# Patient Record
Sex: Female | Born: 1995 | Race: Black or African American | Hispanic: No | Marital: Single | Smoking: Never smoker
Health system: Southern US, Community
[De-identification: ages and names within clinical notes are randomized; demographics above are authoritative.]

## PROBLEM LIST (undated history)

## (undated) DIAGNOSIS — J45909 Unspecified asthma, uncomplicated: Secondary | ICD-10-CM

---

## 2017-05-12 ENCOUNTER — Emergency Department (HOSPITAL_COMMUNITY): Payer: Self-pay

## 2017-05-12 ENCOUNTER — Emergency Department (HOSPITAL_COMMUNITY)
Admission: EM | Admit: 2017-05-12 | Discharge: 2017-05-12 | Disposition: A | Discharge status: Home / Self Care | Payer: Self-pay | Attending: Emergency Medicine | Admitting: Emergency Medicine

## 2017-05-12 ENCOUNTER — Encounter (HOSPITAL_COMMUNITY): Payer: Self-pay | Admitting: *Deleted

## 2017-05-12 DIAGNOSIS — R52 Pain, unspecified: Secondary | ICD-10-CM | POA: Insufficient documentation

## 2017-05-12 DIAGNOSIS — R059 Cough, unspecified: Secondary | ICD-10-CM

## 2017-05-12 DIAGNOSIS — J45909 Unspecified asthma, uncomplicated: Secondary | ICD-10-CM | POA: Insufficient documentation

## 2017-05-12 DIAGNOSIS — R05 Cough: Secondary | ICD-10-CM | POA: Insufficient documentation

## 2017-05-12 HISTORY — DX: Unspecified asthma, uncomplicated: J45.909

## 2017-05-12 LAB — POC URINE PREG, ED: Preg Test, Ur: NEGATIVE

## 2017-05-12 MED ORDER — BENZONATATE 100 MG PO CAPS: 100.0000 mg | ORAL_CAPSULE | Freq: Three times a day (TID) | ORAL | 0 refills | Status: DC

## 2017-05-12 MED ORDER — KETOROLAC TROMETHAMINE 60 MG/2ML IM SOLN
60.0000 mg | Freq: Once | INTRAMUSCULAR | Status: AC
  Administered 2017-05-12: 60 mg via INTRAMUSCULAR
  Filled 2017-05-12: qty 2

## 2017-05-12 MED ORDER — IBUPROFEN 600 MG PO TABS: 600.0000 mg | ORAL_TABLET | Freq: Four times a day (QID) | ORAL | 0 refills | Status: DC | PRN

## 2017-05-12 MED ORDER — ONDANSETRON 4 MG PO TBDP
4.0000 mg | ORAL_TABLET | Freq: Once | ORAL | Status: AC
  Administered 2017-05-12: 4 mg via ORAL
  Filled 2017-05-12: qty 1

## 2017-05-12 MED ORDER — IPRATROPIUM-ALBUTEROL 0.5-2.5 (3) MG/3ML IN SOLN
3.0000 mL | Freq: Once | RESPIRATORY_TRACT | Status: AC
  Administered 2017-05-12: 3 mL via RESPIRATORY_TRACT
  Filled 2017-05-12: qty 3

## 2017-05-12 MED ORDER — ONDANSETRON 4 MG PO TBDP: 4.0000 mg | ORAL_TABLET | Freq: Three times a day (TID) | ORAL | 0 refills | Status: DC | PRN

## 2017-05-12 NOTE — ED Provider Notes (Signed)
[facility]-EMERGENCY DEPT Provider Note   CSN: [id number] Arrival date & time: 05/12/17  1191     History   Chief Complaint Chief Complaint  Patient presents with  . Generalized Body Aches    HPI Jennifer Washington is a 21 y.o. female.  HPI   Jennifer Washington is a 21 y.o. female, with a history of Asthma, presenting to the ED with nonproductive cough beginning 3 days ago. Complains of central chest pain, "shocky, burning, stinging," nonradiating, rated 10/10. Chest pain and back pain worsen with coughing, deep breathing, and palpation. Also endorses nasal and sinus congestion with sinus pain, sore throat, postnasal drip, subjective fever, and body aches. Has taken DayQuil without improvement. Has not drunk any water last three days. Has not had to use inhalers.   Denies diarrhea, vomiting, rashes, neck pain/stiffness, abdominal pain, shortness of breath, or any other complaints.     Past Medical History:  Diagnosis Date  . Asthma     There are no active problems to display for this patient.   History reviewed. No pertinent surgical history.  OB History    No data available       Home Medications    Prior to Admission medications   Medication Sig Start Date End Date Taking? Authorizing Provider  etonogestrel (NEXPLANON) 68 MG IMPL implant 1 each by Subdermal route once.   Yes [provider]  benzonatate (TESSALON) 100 MG capsule Take 1 capsule (100 mg total) by mouth every 8 (eight) hours. 05/12/17   Oliwia Berzins C, PA-C  ibuprofen (ADVIL,MOTRIN) 600 MG tablet Take 1 tablet (600 mg total) by mouth every 6 (six) hours as needed. 05/12/17   Rieley Hausman C, PA-C  ondansetron (ZOFRAN ODT) 4 MG disintegrating tablet Take 1 tablet (4 mg total) by mouth every 8 (eight) hours as needed for nausea or vomiting. 05/12/17   Marcoantonio Legault, Hillard Danker, PA-C    Family History No family history on file.  Social History Social History  Substance Use Topics  . Smoking status: Never Smoker  .  Smokeless tobacco: Never Used  . Alcohol use No     Allergies   Patient has no known allergies.   Review of Systems Review of Systems  Constitutional: Positive for chills and fever.  Respiratory: Positive for cough. Negative for shortness of breath.   Cardiovascular: Positive for chest pain.  Gastrointestinal: Positive for nausea. Negative for abdominal pain, diarrhea and vomiting.  Musculoskeletal: Negative for neck pain and neck stiffness.  Skin: Negative for rash.  All other systems reviewed and are negative.    Physical Exam Updated Vital Signs BP 125/80   Pulse 89   Temp 99.6 F (37.6 C) (Oral)   Resp 16   Ht  (1.651 m)   Wt 65.8 kg (145 lb)   SpO2 99%   BMI 24.13 kg/m   Physical Exam  Constitutional: She appears well-developed and well-nourished. No distress.  HENT:  Head: Normocephalic and atraumatic.  Nose: Right sinus exhibits frontal sinus tenderness. Left sinus exhibits frontal sinus tenderness.  Mouth/Throat: Uvula is midline and mucous membranes are normal. Posterior oropharyngeal edema and posterior oropharyngeal erythema present. No oropharyngeal exudate or tonsillar abscesses.  Eyes: Conjunctivae are normal.  Neck: Normal range of motion and full passive range of motion without pain. Neck supple. No Brudzinski's sign and no Kernig's sign noted.  Cardiovascular: Normal rate, regular rhythm, normal heart sounds and intact distal pulses.   Pulmonary/Chest: Effort normal and breath sounds normal. No respiratory distress.  She exhibits tenderness (Across the entire anterior chest).  Abdominal: Soft. There is no tenderness. There is no guarding.  Musculoskeletal: She exhibits no edema.  Lymphadenopathy:    She has no cervical adenopathy.  Neurological: She is alert.  Skin: Skin is warm and dry. Capillary refill takes less than 2 seconds. She is not diaphoretic.  Psychiatric: She has a normal mood and affect. Her behavior is normal.  Nursing note and  vitals reviewed.    ED Treatments / Results  Labs (all labs ordered are listed, but only abnormal results are displayed) Labs Reviewed  POC URINE PREG, ED    EKG  EKG Interpretation None       Radiology No results found. Results for orders placed or performed during the hospital encounter of 05/12/17  POC urine preg, ED  Result Value Ref Range   Preg Test, Ur NEGATIVE NEGATIVE   Dg Chest 2 View  Result Date: 05/12/2017 CLINICAL DATA:  Cough and shortness of breath. EXAM: CHEST  2 VIEW COMPARISON:  None. FINDINGS: The heart size and mediastinal contours are within normal limits. Both lungs are clear. The visualized skeletal structures are unremarkable. IMPRESSION: No active cardiopulmonary disease. Electronically Signed   By: Gerome Sam III M.D   On: 05/12/2017 08:53   Procedures Procedures (including critical care time)  Medications Ordered in ED Medications  ondansetron (ZOFRAN-ODT) disintegrating tablet 4 mg (4 mg Oral Given 05/12/17 0833)  ketorolac (TORADOL) injection 60 mg (60 mg Intramuscular Given 05/12/17 0829)  ipratropium-albuterol (DUONEB) 0.5-2.5 (3) MG/3ML nebulizer solution 3 mL (3 mLs Nebulization Given 05/12/17 0827)     Initial Impression / Assessment and Plan / ED Course  I have reviewed the triage vital signs and the nursing notes.  Pertinent labs & imaging results that were available during my care of the patient were reviewed by me and considered in my medical decision making (see chart for details).     Patient presents with symptoms consistent with upper respiratory infection. Patient is nontoxic appearing, afebrile, not tachycardic, not tachypneic, not hypotensive, maintains SPO2 of 100% on room air, and is in no apparent distress. Patient has no signs of sepsis or other serious or life-threatening condition. PCP follow-up as needed. The patient was given instructions for home care as well as return precautions. Patient voices understanding of  these instructions, accepts the plan, and is comfortable with discharge.     Final Clinical Impressions(s) / ED Diagnoses   Final diagnoses:  Cough  Body aches    New Prescriptions Discharge Medication List as of 05/12/2017  9:58 AM    START taking these medications   Details  benzonatate (TESSALON) 100 MG capsule Take 1 capsule (100 mg total) by mouth every 8 (eight) hours., Starting Sun 05/12/2017, Print    ibuprofen (ADVIL,MOTRIN) 600 MG tablet Take 1 tablet (600 mg total) by mouth every 6 (six) hours as needed., Starting Sun 05/12/2017, Print    ondansetron (ZOFRAN ODT) 4 MG disintegrating tablet Take 1 tablet (4 mg total) by mouth every 8 (eight) hours as needed for nausea or vomiting., Starting Sun 05/12/2017, Print         Druanne Bosques C, PA-C 05/14/17 1620    Nira Conn, MD 05/15/17 [phone]

## 2017-05-12 NOTE — ED Notes (Signed)
Patient given water to drink.  

## 2017-05-12 NOTE — Discharge Instructions (Signed)
Your symptoms are consistent with a viral illness. Viruses do not require antibiotics. Treatment is symptomatic care and it is important to note that these symptoms may last for 7-14 days.  ° °Hand washing: Wash your hands throughout the day, but especially before and after touching the face, using the restroom, sneezing, coughing, or touching surfaces that have been coughed or sneezed upon. °Hydration: Symptoms will be intensified and complicated by dehydration. Dehydration can also extend the duration of symptoms. Drink plenty of fluids and get plenty of rest. You should be drinking at least half a liter of water an hour to stay hydrated. Electrolyte drinks are also encouraged. You should be drinking enough fluids to make your urine light yellow, almost clear. If this is not the case, you are not drinking enough water. °Please note that some of the treatments indicated below will not be effective if you are not adequately hydrated. °Pain or fever: Ibuprofen, Naproxen, or Tylenol for pain or fever.  °Nausea/vomiting: Use the Zofran for nausea or vomiting.  °Cough: Use the Tessalon for cough.  °Congestion: Plain Mucinex may help relieve congestion. Saline sinus rinses and saline nasal sprays may also help relieve congestion. If you do not have heart problems or an allergy to such medications, you may also try phenylephrine or Sudafed. °Sore throat: Warm liquids or Chloraseptic spray may help soothe a sore throat. Gargle twice a day with a salt water solution made from a half teaspoon of salt in a cup of warm water.  °Follow up: Follow up with a primary care provider, as needed, for any future management of this issue. °

## 2017-05-12 NOTE — ED Triage Notes (Signed)
The pt is c/o headache cold chills head and chest congestion sore throat  Body aches for 3 days lmp none implant

## 2017-05-12 NOTE — ED Notes (Signed)
Neg on POC Peg Test

## 2017-05-14 ENCOUNTER — Telehealth: Payer: Self-pay | Admitting: *Deleted

## 2017-11-14 ENCOUNTER — Other Ambulatory Visit: Payer: Self-pay

## 2017-11-14 ENCOUNTER — Emergency Department (HOSPITAL_COMMUNITY): Payer: PRIVATE HEALTH INSURANCE

## 2017-11-14 ENCOUNTER — Emergency Department (HOSPITAL_COMMUNITY)
Admission: EM | Admit: 2017-11-14 | Discharge: 2017-11-14 | Disposition: A | Discharge status: Home / Self Care | Payer: PRIVATE HEALTH INSURANCE | Attending: Emergency Medicine | Admitting: Emergency Medicine

## 2017-11-14 ENCOUNTER — Encounter (HOSPITAL_COMMUNITY): Payer: Self-pay | Admitting: Emergency Medicine

## 2017-11-14 DIAGNOSIS — M25522 Pain in left elbow: Secondary | ICD-10-CM

## 2017-11-14 DIAGNOSIS — Z79899 Other long term (current) drug therapy: Secondary | ICD-10-CM | POA: Diagnosis not present

## 2017-11-14 DIAGNOSIS — Y998 Other external cause status: Secondary | ICD-10-CM | POA: Diagnosis not present

## 2017-11-14 DIAGNOSIS — W01198A Fall on same level from slipping, tripping and stumbling with subsequent striking against other object, initial encounter: Secondary | ICD-10-CM | POA: Insufficient documentation

## 2017-11-14 DIAGNOSIS — J45909 Unspecified asthma, uncomplicated: Secondary | ICD-10-CM | POA: Diagnosis not present

## 2017-11-14 DIAGNOSIS — S59902A Unspecified injury of left elbow, initial encounter: Secondary | ICD-10-CM | POA: Diagnosis present

## 2017-11-14 DIAGNOSIS — Y9351 Activity, roller skating (inline) and skateboarding: Secondary | ICD-10-CM | POA: Diagnosis not present

## 2017-11-14 DIAGNOSIS — S5002XA Contusion of left elbow, initial encounter: Secondary | ICD-10-CM | POA: Insufficient documentation

## 2017-11-14 DIAGNOSIS — Y92838 Other recreation area as the place of occurrence of the external cause: Secondary | ICD-10-CM | POA: Diagnosis not present

## 2017-11-14 LAB — POC URINE PREG, ED: Preg Test, Ur: NEGATIVE

## 2017-11-14 MED ORDER — ACETAMINOPHEN 325 MG PO TABS
650.0000 mg | ORAL_TABLET | Freq: Once | ORAL | Status: AC
  Administered 2017-11-14: 650 mg via ORAL
  Filled 2017-11-14: qty 2

## 2017-11-14 NOTE — ED Notes (Signed)
Please see PA note for assessment

## 2017-11-14 NOTE — ED Triage Notes (Signed)
Pt fell roller skating onto her left elbow.  She is concerned b/c it is "changing colors" and is painful to lean on.  Pt reports when she bends it "it pops"

## 2017-11-14 NOTE — Discharge Instructions (Signed)
You can take Tylenol or Ibuprofen as directed for pain. You can alternate Tylenol and Ibuprofen every 4 hours. If you take Tylenol at 1pm, then you can take Ibuprofen at 5pm. Then you can take Tylenol again at 9pm.   Follow the RICE (Rest, Ice, Compression, Elevation) protocol as directed.   Follow-up with your primary care doctor in the next 2-4 days for further evaluation.  As we discussed, return to the emergency department for any worsening pain, worsening swelling, redness, warmth or any other worsening or concerning symptoms.

## 2017-11-14 NOTE — ED Provider Notes (Signed)
[facility] EMERGENCY DEPARTMENT Provider Note   CSN: [id number] Arrival date & time: 11/14/17  1853     History   Chief Complaint Chief Complaint  Patient presents with  . Elbow Pain    HPI Jennifer Washington is a 22 y.o. female who presents for evaluation of left elbow pain that began 2 days ago after mechanical fall.  Patient reports that she was at a rollerskating rink when she had a mechanical fall, causing her to land on her left elbow.  Patient reports that she did not hit her head or lose consciousness.  Patient reports that since then, she has had continued left elbow pain.  She started noticing some soft tissue swelling and bruising to the area.  She states that she hears a popping sound whenever she extends the elbow.  She has not been taking any medications for the pain.  She does report some tingling and shooting pain that extends down to her left upper extremity.  Patient denies any numbness/weakness,, fevers, redness or swelling.  The history is provided by the patient.    Past Medical History:  Diagnosis Date  . Asthma     There are no active problems to display for this patient.   History reviewed. No pertinent surgical history.  OB History   None      Home Medications    Prior to Admission medications   Medication Sig Start Date End Date Taking? Authorizing Provider  benzonatate (TESSALON) 100 MG capsule Take 1 capsule (100 mg total) by mouth every 8 (eight) hours. 05/12/17   Joy, Shawn C, PA-C  etonogestrel (NEXPLANON) 68 MG IMPL implant 1 each by Subdermal route once.    [provider]  ibuprofen (ADVIL,MOTRIN) 600 MG tablet Take 1 tablet (600 mg total) by mouth every 6 (six) hours as needed. 05/12/17   Joy, Shawn C, PA-C  ondansetron (ZOFRAN ODT) 4 MG disintegrating tablet Take 1 tablet (4 mg total) by mouth every 8 (eight) hours as needed for nausea or vomiting. 05/12/17   Joy, Hillard Danker, PA-C    Family History No family  history on file.  Social History Social History   Tobacco Use  . Smoking status: Never Smoker  . Smokeless tobacco: Never Used  Substance Use Topics  . Alcohol use: No  . Drug use: No     Allergies   Patient has no known allergies.   Review of Systems Review of Systems  Constitutional: Negative for fever.  Musculoskeletal:       Elbow pain  Skin: Positive for color change.  Neurological: Negative for weakness and numbness.     Physical Exam Updated Vital Signs BP (!) 142/85   Pulse 78   Temp 99.5 F (37.5 C) (Oral)   Resp 16   Ht 5\' 5"  (1.651 m)   Wt 66.7 kg (147 lb)   SpO2 100%   BMI 24.46 kg/m   Physical Exam  Constitutional: She appears well-developed and well-nourished.  HENT:  Head: Normocephalic and atraumatic.  Eyes: Conjunctivae and EOM are normal. Right eye exhibits no discharge. Left eye exhibits no discharge. No scleral icterus.  Cardiovascular:  Pulses:      Radial pulses are 2+ on the right side, and 2+ on the left side.  Pulmonary/Chest: Effort normal.  Musculoskeletal:  Tenderness palpation noted to the medial aspect of the left elbow with overlying soft tissue swelling and ecchymosis.  No deformity or crepitus noted.  Flexion/extension intact.  Mild tenderness palpation  to the distal left humerus.  No deformity or crepitus noted.  No tenderness palpation to the left forearm, left wrist.  No snuffbox tenderness.  Full range of motion of left wrist intact without any difficulty.  Full range of motion of all 5 digits of left hand intact without any difficulty.  Neurological: She is alert.  Sensation intact along major nerve distributions of BUE  Skin: Skin is warm and dry. Capillary refill takes less than 2 seconds.  Good distal cap refill. LUE is not dusky in appearance or cool to touch.  Psychiatric: She has a normal mood and affect. Her speech is normal and behavior is normal.  Nursing note and vitals reviewed.    ED Treatments / Results    Labs (all labs ordered are listed, but only abnormal results are displayed) Labs Reviewed  POC URINE PREG, ED    EKG  EKG Interpretation None       Radiology Dg Elbow Complete Left  Result Date: 11/14/2017 CLINICAL DATA:  Patient fell on left elbow 2 days ago while roller-skating. EXAM: LEFT ELBOW - COMPLETE 3+ VIEW COMPARISON:  None. FINDINGS: There is no evidence of fracture, dislocation, or joint effusion. There is no evidence of arthropathy or other focal bone abnormality. Soft tissue induration is seen along the posteromedial aspect of the elbow. IMPRESSION: 1. Mild soft tissue swelling consistent with a soft tissue contusion of the posteromedial elbow. 2. No acute fracture nor joint dislocation. 3. No joint effusion. Electronically Signed   By: Tollie Ethavid  Kwon M.D.   On: 11/14/2017 20:24   Dg Humerus Left  Result Date: 11/14/2017 CLINICAL DATA:  Arm pain after fall 2 days ago. EXAM: LEFT HUMERUS - 2+ VIEW COMPARISON:  None. FINDINGS: There is no evidence of fracture or other focal bone lesions. Contraceptive implant within the medial soft tissues of the upper arm. Soft tissues are otherwise unremarkable. IMPRESSION: Negative. Electronically Signed   By: Obie DredgeWilliam T Derry M.D.   On: 11/14/2017 20:24    Procedures Procedures (including critical care time)  Medications Ordered in ED Medications  acetaminophen (TYLENOL) tablet 650 mg (650 mg Oral Given 11/14/17 2031)     Initial Impression / Assessment and Plan / ED Course  I have reviewed the triage vital signs and the nursing notes.  Pertinent labs & imaging results that were available during my care of the patient were reviewed by me and considered in my medical decision making (see chart for details).     22 year old female who presents for evaluation of left elbow pain that has been ongoing for last 2 days.  Symptoms began after mechanical fall at a rollerskating rink.  Patient fell and landed on her left elbow.  No head  injury, LOC.  Has reported worsening pain, swelling, ecchymosis to the area since then. Patient is afebrile, non-toxic appearing, sitting comfortably on examination table. Vital signs reviewed and stable. Vital signs reviewed and stable. Patient is neurovascularly intact. Physical exam shows tenderness to palpation to the ulnar aspect of the left elbow there is some mild overlying soft tissue swelling and ecchymosis.  Flexion/extension intact without difficulty.  Consider sprain versus fracture versus dislocation.  X-rays ordered at triage. Analgesics provided in the department.  X-rays reviewed.  Negative for any acute fracture or dislocation.  There is no evidence of joint effusion that would indicate occult fracture.  There is mention of some mild soft tissue swelling to the medial aspect that is consistent with patient's exam.  I discussed results  with patient.  She reports improvement in pain after analgesics.  Encouraged at home supportive care measures.  Vital signs are reassuring.  Patient stable for discharge at this time. Patient had ample opportunity for questions and discussion. All patient's questions were answered with full understanding. Strict return precautions discussed. Patient expresses understanding and agreement to plan.    Final Clinical Impressions(s) / ED Diagnoses   Final diagnoses:  Left elbow pain  Contusion of left elbow, initial encounter    ED Discharge Orders    None       Rosana Hoes 11/14/17 2311    Charlynne Pander, MD 11/14/17 [phone]

## 2018-09-19 ENCOUNTER — Other Ambulatory Visit: Payer: Self-pay

## 2018-09-19 ENCOUNTER — Emergency Department (HOSPITAL_COMMUNITY)
Admission: EM | Admit: 2018-09-19 | Discharge: 2018-09-19 | Disposition: A | Discharge status: Home / Self Care | Payer: Medicaid - Out of State | Attending: Emergency Medicine | Admitting: Emergency Medicine

## 2018-09-19 ENCOUNTER — Encounter (HOSPITAL_COMMUNITY): Payer: Self-pay

## 2018-09-19 ENCOUNTER — Emergency Department (HOSPITAL_COMMUNITY): Payer: Medicaid - Out of State

## 2018-09-19 DIAGNOSIS — J45909 Unspecified asthma, uncomplicated: Secondary | ICD-10-CM | POA: Insufficient documentation

## 2018-09-19 DIAGNOSIS — B9689 Other specified bacterial agents as the cause of diseases classified elsewhere: Secondary | ICD-10-CM | POA: Insufficient documentation

## 2018-09-19 DIAGNOSIS — N760 Acute vaginitis: Secondary | ICD-10-CM | POA: Insufficient documentation

## 2018-09-19 DIAGNOSIS — Y999 Unspecified external cause status: Secondary | ICD-10-CM | POA: Insufficient documentation

## 2018-09-19 DIAGNOSIS — R1084 Generalized abdominal pain: Secondary | ICD-10-CM | POA: Diagnosis not present

## 2018-09-19 DIAGNOSIS — S0003XA Contusion of scalp, initial encounter: Secondary | ICD-10-CM | POA: Diagnosis not present

## 2018-09-19 DIAGNOSIS — T07XXXA Unspecified multiple injuries, initial encounter: Secondary | ICD-10-CM | POA: Insufficient documentation

## 2018-09-19 DIAGNOSIS — Y9389 Activity, other specified: Secondary | ICD-10-CM | POA: Insufficient documentation

## 2018-09-19 DIAGNOSIS — S0990XA Unspecified injury of head, initial encounter: Secondary | ICD-10-CM | POA: Diagnosis present

## 2018-09-19 DIAGNOSIS — Y92003 Bedroom of unspecified non-institutional (private) residence as the place of occurrence of the external cause: Secondary | ICD-10-CM | POA: Diagnosis not present

## 2018-09-19 LAB — CBC WITH DIFFERENTIAL/PLATELET
ABS IMMATURE GRANULOCYTES: 0.02 10*3/uL (ref 0.00–0.07)
Basophils Absolute: 0 10*3/uL (ref 0.0–0.1)
Basophils Relative: 0 %
Eosinophils Absolute: 0.1 10*3/uL (ref 0.0–0.5)
Eosinophils Relative: 1 %
HEMATOCRIT: 43.7 % (ref 36.0–46.0)
HEMOGLOBIN: 14.5 g/dL (ref 12.0–15.0)
Immature Granulocytes: 0 %
LYMPHS ABS: 1.8 10*3/uL (ref 0.7–4.0)
LYMPHS PCT: 21 %
MCH: 31.1 pg (ref 26.0–34.0)
MCHC: 33.2 g/dL (ref 30.0–36.0)
MCV: 93.8 fL (ref 80.0–100.0)
MONO ABS: 0.7 10*3/uL (ref 0.1–1.0)
MONOS PCT: 8 %
NEUTROS ABS: 5.9 10*3/uL (ref 1.7–7.7)
Neutrophils Relative %: 70 %
Platelets: 259 10*3/uL (ref 150–400)
RBC: 4.66 MIL/uL (ref 3.87–5.11)
RDW: 12 % (ref 11.5–15.5)
WBC: 8.6 10*3/uL (ref 4.0–10.5)
nRBC: 0 % (ref 0.0–0.2)

## 2018-09-19 LAB — COMPREHENSIVE METABOLIC PANEL
ALBUMIN: 4.8 g/dL (ref 3.5–5.0)
ALT: 14 U/L (ref 0–44)
AST: 22 U/L (ref 15–41)
Alkaline Phosphatase: 57 U/L (ref 38–126)
Anion gap: 13 (ref 5–15)
BUN: 13 mg/dL (ref 6–20)
CHLORIDE: 105 mmol/L (ref 98–111)
CO2: 20 mmol/L — ABNORMAL LOW (ref 22–32)
CREATININE: 1 mg/dL (ref 0.44–1.00)
Calcium: 10.1 mg/dL (ref 8.9–10.3)
GFR calc Af Amer: >60 mL/min (ref >60)
GFR calc non Af Amer: >60 mL/min (ref >60)
GLUCOSE: 93 mg/dL (ref 70–99)
POTASSIUM: 3.4 mmol/L — AB (ref 3.5–5.1)
Sodium: 138 mmol/L (ref 135–145)
Total Bilirubin: 1.4 mg/dL — ABNORMAL HIGH (ref 0.3–1.2)
Total Protein: 8 g/dL (ref 6.5–8.1)

## 2018-09-19 LAB — URINALYSIS, ROUTINE W REFLEX MICROSCOPIC
BILIRUBIN URINE: NEGATIVE
Glucose, UA: NEGATIVE mg/dL
Hgb urine dipstick: NEGATIVE
KETONES UR: 20 mg/dL — AB
Leukocytes, UA: NEGATIVE
Nitrite: POSITIVE — AB
Protein, ur: 30 mg/dL — AB
Specific Gravity, Urine: 1.02 (ref 1.005–1.030)
pH: 6 (ref 5.0–8.0)

## 2018-09-19 LAB — WET PREP, GENITAL
Trich, Wet Prep: NONE SEEN
YEAST WET PREP: NONE SEEN

## 2018-09-19 LAB — PREGNANCY, URINE: Preg Test, Ur: NEGATIVE

## 2018-09-19 MED ORDER — METRONIDAZOLE 500 MG PO TABS: 500.0000 mg | ORAL_TABLET | Freq: Two times a day (BID) | ORAL | 0 refills | Status: DC

## 2018-09-19 MED ORDER — IBUPROFEN 600 MG PO TABS: 600.0000 mg | ORAL_TABLET | Freq: Four times a day (QID) | ORAL | 0 refills | Status: DC | PRN

## 2018-09-19 MED ORDER — ONDANSETRON HCL 4 MG/2ML IJ SOLN
4.0000 mg | Freq: Once | INTRAMUSCULAR | Status: AC
  Administered 2018-09-19: 4 mg via INTRAVENOUS
  Filled 2018-09-19: qty 2

## 2018-09-19 MED ORDER — METRONIDAZOLE 500 MG PO TABS
500.0000 mg | ORAL_TABLET | Freq: Once | ORAL | Status: AC
  Administered 2018-09-19: 500 mg via ORAL
  Filled 2018-09-19: qty 1

## 2018-09-19 MED ORDER — SODIUM CHLORIDE 0.9 % IV BOLUS
1000.0000 mL | Freq: Once | INTRAVENOUS | Status: AC
  Administered 2018-09-19: 1000 mL via INTRAVENOUS

## 2018-09-19 MED ORDER — IOHEXOL 300 MG/ML  SOLN
100.0000 mL | Freq: Once | INTRAMUSCULAR | Status: AC | PRN
  Administered 2018-09-19: 100 mL via INTRAVENOUS

## 2018-09-19 MED ORDER — MORPHINE SULFATE (PF) 4 MG/ML IV SOLN: 4.0000 mg | Freq: Once | INTRAVENOUS | Status: DC

## 2018-09-19 MED ORDER — MORPHINE SULFATE (PF) 4 MG/ML IV SOLN
4.0000 mg | Freq: Once | INTRAVENOUS | Status: AC
  Administered 2018-09-19: 4 mg via INTRAVENOUS
  Filled 2018-09-19: qty 1

## 2018-09-19 MED ORDER — HYDROCODONE-ACETAMINOPHEN 5-325 MG PO TABS: 1.0000 | ORAL_TABLET | ORAL | 0 refills | Status: DC | PRN
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
## 2018-09-19 NOTE — ED Notes (Signed)
Patient verbalizes understanding of discharge instructions. Opportunity for questioning and answers were provided. Armband removed by staff, pt discharged from ED.  

## 2018-09-19 NOTE — ED Notes (Signed)
Patient transported to CT 

## 2018-09-20 ENCOUNTER — Encounter (HOSPITAL_COMMUNITY): Payer: Self-pay | Admitting: Emergency Medicine

## 2018-09-20 ENCOUNTER — Emergency Department (HOSPITAL_COMMUNITY)
Admission: EM | Admit: 2018-09-20 | Discharge: 2018-09-20 | Disposition: A | Discharge status: Home / Self Care | Payer: Medicaid - Out of State | Attending: Emergency Medicine | Admitting: Emergency Medicine

## 2018-09-20 DIAGNOSIS — S060X0D Concussion without loss of consciousness, subsequent encounter: Secondary | ICD-10-CM | POA: Diagnosis not present

## 2018-09-20 DIAGNOSIS — J45909 Unspecified asthma, uncomplicated: Secondary | ICD-10-CM | POA: Diagnosis not present

## 2018-09-20 DIAGNOSIS — S060X0A Concussion without loss of consciousness, initial encounter: Secondary | ICD-10-CM

## 2018-09-20 DIAGNOSIS — R42 Dizziness and giddiness: Secondary | ICD-10-CM | POA: Diagnosis present

## 2018-09-20 LAB — COMPREHENSIVE METABOLIC PANEL
ALT: 15 U/L (ref 0–44)
AST: 21 U/L (ref 15–41)
Albumin: 5.1 g/dL — ABNORMAL HIGH (ref 3.5–5.0)
Alkaline Phosphatase: 54 U/L (ref 38–126)
Anion gap: 9 (ref 5–15)
BUN: 15 mg/dL (ref 6–20)
CO2: 21 mmol/L — AB (ref 22–32)
Calcium: 9.5 mg/dL (ref 8.9–10.3)
Chloride: 107 mmol/L (ref 98–111)
Creatinine, Ser: 0.91 mg/dL (ref 0.44–1.00)
GFR calc Af Amer: >60 mL/min (ref >60)
GFR calc non Af Amer: >60 mL/min (ref >60)
Glucose, Bld: 84 mg/dL (ref 70–99)
Potassium: 3.7 mmol/L (ref 3.5–5.1)
Sodium: 137 mmol/L (ref 135–145)
Total Bilirubin: 2.3 mg/dL — ABNORMAL HIGH (ref 0.3–1.2)
Total Protein: 8.1 g/dL (ref 6.5–8.1)

## 2018-09-20 LAB — LIPASE, BLOOD: Lipase: 34 U/L (ref 11–51)

## 2018-09-20 LAB — URINALYSIS, ROUTINE W REFLEX MICROSCOPIC
Bacteria, UA: NONE SEEN
Bilirubin Urine: NEGATIVE
Glucose, UA: NEGATIVE mg/dL
KETONES UR: 80 mg/dL — AB
Nitrite: NEGATIVE
Protein, ur: NEGATIVE mg/dL
Specific Gravity, Urine: 1.025 (ref 1.005–1.030)
pH: 6 (ref 5.0–8.0)

## 2018-09-20 LAB — CBC
HCT: 42.5 % (ref 36.0–46.0)
Hemoglobin: 13.9 g/dL (ref 12.0–15.0)
MCH: 31.7 pg (ref 26.0–34.0)
MCHC: 32.7 g/dL (ref 30.0–36.0)
MCV: 96.8 fL (ref 80.0–100.0)
Platelets: 226 10*3/uL (ref 150–400)
RBC: 4.39 MIL/uL (ref 3.87–5.11)
RDW: 12 % (ref 11.5–15.5)
WBC: 6.3 10*3/uL (ref 4.0–10.5)
nRBC: 0 % (ref 0.0–0.2)

## 2018-09-20 LAB — I-STAT BETA HCG BLOOD, ED (MC, WL, AP ONLY): I-stat hCG, quantitative: <5 m[IU]/mL (ref <5)

## 2018-09-20 MED ORDER — SODIUM CHLORIDE 0.9% FLUSH: 3.0000 mL | Freq: Once | INTRAVENOUS | Status: DC

## 2018-09-20 MED ORDER — DIPHENHYDRAMINE HCL 50 MG/ML IJ SOLN
12.5000 mg | Freq: Once | INTRAMUSCULAR | Status: AC
  Administered 2018-09-20: 12.5 mg via INTRAVENOUS
  Filled 2018-09-20: qty 1

## 2018-09-20 MED ORDER — METOCLOPRAMIDE HCL 5 MG/ML IJ SOLN
10.0000 mg | Freq: Once | INTRAMUSCULAR | Status: AC
  Administered 2018-09-20: 10 mg via INTRAVENOUS
  Filled 2018-09-20: qty 2

## 2018-09-20 MED ORDER — SODIUM CHLORIDE 0.9 % IV BOLUS
1000.0000 mL | Freq: Once | INTRAVENOUS | Status: AC
  Administered 2018-09-20: 1000 mL via INTRAVENOUS
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
## 2018-09-20 NOTE — Discharge Instructions (Addendum)
You can take Tylenol or ibuprofen every 6 hours as needed for headache. Drink plenty of water. Get plenty of rest. Avoid any contact sports/activities for at least 1 week and until your symptoms have completely resolved.   Return to the emergency department if you develop sudden severely worsening headache, persistent vomiting, vision changes, or new or worsening symptoms.

## 2018-09-21 ENCOUNTER — Emergency Department (HOSPITAL_COMMUNITY): Payer: Medicaid - Out of State

## 2018-09-21 ENCOUNTER — Emergency Department (HOSPITAL_COMMUNITY)
Admission: EM | Admit: 2018-09-21 | Discharge: 2018-09-21 | Disposition: A | Discharge status: Home / Self Care | Payer: Medicaid - Out of State | Attending: Emergency Medicine | Admitting: Emergency Medicine

## 2018-09-21 DIAGNOSIS — T1490XA Injury, unspecified, initial encounter: Secondary | ICD-10-CM

## 2018-09-21 DIAGNOSIS — S61214A Laceration without foreign body of right ring finger without damage to nail, initial encounter: Secondary | ICD-10-CM | POA: Insufficient documentation

## 2018-09-21 DIAGNOSIS — Y998 Other external cause status: Secondary | ICD-10-CM | POA: Diagnosis not present

## 2018-09-21 DIAGNOSIS — Y9289 Other specified places as the place of occurrence of the external cause: Secondary | ICD-10-CM | POA: Insufficient documentation

## 2018-09-21 DIAGNOSIS — Y9389 Activity, other specified: Secondary | ICD-10-CM | POA: Diagnosis not present

## 2018-09-21 DIAGNOSIS — S29012A Strain of muscle and tendon of back wall of thorax, initial encounter: Secondary | ICD-10-CM | POA: Diagnosis not present

## 2018-09-21 DIAGNOSIS — Z23 Encounter for immunization: Secondary | ICD-10-CM | POA: Insufficient documentation

## 2018-09-21 MED ORDER — TETANUS-DIPHTH-ACELL PERTUSSIS 5-2.5-18.5 LF-MCG/0.5 IM SUSP
0.5000 mL | Freq: Once | INTRAMUSCULAR | Status: AC
  Administered 2018-09-21: 0.5 mL via INTRAMUSCULAR
  Filled 2018-09-21: qty 0.5

## 2018-09-21 MED ORDER — LIDOCAINE-EPINEPHRINE (PF) 2 %-1:200000 IJ SOLN
10.0000 mL | Freq: Once | INTRAMUSCULAR | Status: AC
  Administered 2018-09-21: 10 mL
  Filled 2018-09-21: qty 20

## 2018-09-21 MED ORDER — BUPIVACAINE HCL (PF) 0.5 % IJ SOLN
10.0000 mL | Freq: Once | INTRAMUSCULAR | Status: AC
  Administered 2018-09-21: 10 mL
  Filled 2018-09-21: qty 10

## 2018-09-21 MED ORDER — CYCLOBENZAPRINE HCL 10 MG PO TABS
10.0000 mg | ORAL_TABLET | Freq: Once | ORAL | Status: AC
  Administered 2018-09-21: 10 mg via ORAL
  Filled 2018-09-21: qty 1

## 2018-09-21 NOTE — ED Notes (Signed)
Paged Dr. Everardo PacificVarkey for Dr. Rosalia Hammersay

## 2018-09-21 NOTE — Progress Notes (Signed)
Orthopedic Tech Progress Note Patient Details:  Jennifer Washington 1996-05-23 [id number]  Ortho Devices Type of Ortho Device: Finger splint Ortho Device/Splint Location: RUE Ortho Device/Splint Interventions: Ordered, Application   Post Interventions Patient Tolerated: Well Instructions Provided: Care of device   Jennye MoccasinHughes, Satish Hammers Craig 09/21/2018, 2:00 PM
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
## 2018-09-21 NOTE — ED Notes (Signed)
Wet gauze placed on pts finger. Suture trey at bedside and pt placed in gown.

## 2018-09-21 NOTE — ED Notes (Signed)
Urine at bedside. 

## 2018-09-21 NOTE — ED Notes (Signed)
Paged orth Tech for partial splint and bulky dressing.

## 2018-09-22 LAB — GC/CHLAMYDIA PROBE AMP (~~LOC~~) NOT AT ARMC
CHLAMYDIA, DNA PROBE: POSITIVE — AB
Neisseria Gonorrhea: NEGATIVE

## 2018-11-14 ENCOUNTER — Encounter (HOSPITAL_COMMUNITY): Payer: Self-pay | Admitting: *Deleted

## 2018-11-14 ENCOUNTER — Other Ambulatory Visit: Payer: Self-pay

## 2018-11-14 ENCOUNTER — Emergency Department (HOSPITAL_COMMUNITY)
Admission: EM | Admit: 2018-11-14 | Discharge: 2018-11-14 | Disposition: A | Discharge status: Home / Self Care | Payer: Medicaid - Out of State | Attending: Emergency Medicine | Admitting: Emergency Medicine

## 2018-11-14 DIAGNOSIS — Z79899 Other long term (current) drug therapy: Secondary | ICD-10-CM | POA: Insufficient documentation

## 2018-11-14 DIAGNOSIS — R197 Diarrhea, unspecified: Secondary | ICD-10-CM | POA: Insufficient documentation

## 2018-11-14 DIAGNOSIS — J45909 Unspecified asthma, uncomplicated: Secondary | ICD-10-CM | POA: Insufficient documentation

## 2018-11-14 DIAGNOSIS — R112 Nausea with vomiting, unspecified: Secondary | ICD-10-CM | POA: Insufficient documentation

## 2018-11-14 MED ORDER — ONDANSETRON HCL 4 MG PO TABS: 4.0000 mg | ORAL_TABLET | Freq: Four times a day (QID) | ORAL | 0 refills | Status: DC | PRN

## 2018-11-14 MED ORDER — LOPERAMIDE HCL 2 MG PO CAPS
4.0000 mg | ORAL_CAPSULE | Freq: Once | ORAL | Status: AC
  Administered 2018-11-14: 4 mg via ORAL
  Filled 2018-11-14: qty 2

## 2018-11-14 MED ORDER — ONDANSETRON 4 MG PO TBDP
4.0000 mg | ORAL_TABLET | Freq: Once | ORAL | Status: AC
  Administered 2018-11-14: 4 mg via ORAL
  Filled 2018-11-14: qty 1

## 2018-11-14 NOTE — Discharge Instructions (Addendum)
You can take 2mg  of imodium every time you have diarrhea up to five times per day.
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
## 2018-11-16 NOTE — ED Provider Notes (Signed)
[facility] EMERGENCY DEPARTMENT Provider Note   CSN: [id number] Arrival date & time: 11/14/18  1540    History   Chief Complaint Chief Complaint  Patient presents with  . Diarrhea  . Emesis    HPI Jennifer Washington is a 23 y.o. female.  HPI   22yF with n/v/d. Onset last week. Persistent since. Initially with fever which has since resolved. No blood in stool or emesis. Sick contacts recently with GI symptoms. No recent abx use. No significant travel hx.   Past Medical History:  Diagnosis Date  . Asthma     There are no active problems to display for this patient.   History reviewed. No pertinent surgical history.   OB History   No obstetric history on file.      Home Medications    Prior to Admission medications   Medication Sig Start Date End Date Taking? Authorizing Provider  benzonatate (TESSALON) 100 MG capsule Take 1 capsule (100 mg total) by mouth every 8 (eight) hours. 05/12/17   Joy, Shawn C, PA-C  etonogestrel (NEXPLANON) 68 MG IMPL implant 1 each by Subdermal route once.    [provider]  HYDROcodone-acetaminophen (NORCO/VICODIN) 5-325 MG tablet Take 1 tablet by mouth every 4 (four) hours as needed. 09/19/18   Jacalyn Lefevre, MD  ibuprofen (ADVIL,MOTRIN) 600 MG tablet Take 1 tablet (600 mg total) by mouth every 6 (six) hours as needed. 09/19/18   Jacalyn Lefevre, MD  metroNIDAZOLE (FLAGYL) 500 MG tablet Take 1 tablet (500 mg total) by mouth 2 (two) times daily. 09/19/18   Jacalyn Lefevre, MD  ondansetron (ZOFRAN ODT) 4 MG disintegrating tablet Take 1 tablet (4 mg total) by mouth every 8 (eight) hours as needed for nausea or vomiting. 05/12/17   Joy, Shawn C, PA-C  ondansetron (ZOFRAN) 4 MG tablet Take 1 tablet (4 mg total) by mouth every 6 (six) hours as needed for nausea or vomiting. 11/14/18   Raeford Razor, MD    Family History No family history on file.  Social History Social History   Tobacco Use  . Smoking status: Never  Smoker  . Smokeless tobacco: Never Used  Substance Use Topics  . Alcohol use: No  . Drug use: No     Allergies   Patient has no known allergies.   Review of Systems Review of Systems All systems reviewed and negative, other than as noted in HPI.   Physical Exam Updated Vital Signs BP 136/84 (BP Location: Right Arm)   Pulse 92   Temp 98.6 F (37 C) Comment: Pt states her fever was 103 last night; has not taken any meds  Resp 16   SpO2 99%   Physical Exam Vitals signs and nursing note reviewed.  Constitutional:      General: She is not in acute distress.    Appearance: She is well-developed.  HENT:     Head: Normocephalic and atraumatic.  Eyes:     General:        Right eye: No discharge.        Left eye: No discharge.     Conjunctiva/sclera: Conjunctivae normal.  Neck:     Musculoskeletal: Neck supple.  Cardiovascular:     Rate and Rhythm: Normal rate and regular rhythm.     Heart sounds: Normal heart sounds. No murmur. No friction rub. No gallop.   Pulmonary:     Effort: Pulmonary effort is normal. No respiratory distress.     Breath sounds: Normal breath sounds.  Abdominal:     General: There is no distension.     Palpations: Abdomen is soft.     Tenderness: There is no abdominal tenderness.  Musculoskeletal:        General: No tenderness.  Skin:    General: Skin is warm and dry.  Neurological:     Mental Status: She is alert.  Psychiatric:        Behavior: Behavior normal.        Thought Content: Thought content normal.      ED Treatments / Results  Labs (all labs ordered are listed, but only abnormal results are displayed) Labs Reviewed  GASTROINTESTINAL PANEL BY PCR, STOOL (REPLACES STOOL CULTURE)    EKG None  Radiology No results found.  Procedures Procedures (including critical care time)  Medications Ordered in ED Medications  loperamide (IMODIUM) capsule 4 mg (4 mg Oral Given 11/14/18 1752)  ondansetron (ZOFRAN-ODT)  disintegrating tablet 4 mg (4 mg Oral Given 11/14/18 1754)     Initial Impression / Assessment and Plan / ED Course  I have reviewed the triage vital signs and the nursing notes.  Pertinent labs & imaging results that were available during my care of the patient were reviewed by me and considered in my medical decision making (see chart for details).        22yF with n/v/d. None in ED. Could not provide stool sample. Benign abdomen. Afebrile. Denies blood. I doubt serious bacterial illness or other emergent process. Plans symptomatic tx. Return precautions discussed.   Final Clinical Impressions(s) / ED Diagnoses   Final diagnoses:  Diarrhea, unspecified type  Nausea and vomiting, intractability of vomiting not specified, unspecified vomiting type    ED Discharge Orders         Ordered    ondansetron (ZOFRAN) 4 MG tablet  Every 6 hours PRN     11/14/18 1738           Raeford Razor, MD 11/16/18 1831

## 2019-07-16 ENCOUNTER — Emergency Department (HOSPITAL_COMMUNITY)
Admission: EM | Admit: 2019-07-16 | Discharge: 2019-07-16 | Disposition: A | Discharge status: Home / Self Care | Payer: Medicaid - Out of State | Attending: Emergency Medicine | Admitting: Emergency Medicine

## 2019-07-16 ENCOUNTER — Emergency Department (HOSPITAL_COMMUNITY): Payer: Medicaid - Out of State

## 2019-07-16 ENCOUNTER — Other Ambulatory Visit: Payer: Self-pay

## 2019-07-16 DIAGNOSIS — N939 Abnormal uterine and vaginal bleeding, unspecified: Secondary | ICD-10-CM | POA: Insufficient documentation

## 2019-07-16 DIAGNOSIS — M545 Low back pain, unspecified: Secondary | ICD-10-CM

## 2019-07-16 DIAGNOSIS — Z79899 Other long term (current) drug therapy: Secondary | ICD-10-CM | POA: Diagnosis not present

## 2019-07-16 DIAGNOSIS — R1032 Left lower quadrant pain: Secondary | ICD-10-CM | POA: Diagnosis not present

## 2019-07-16 DIAGNOSIS — R112 Nausea with vomiting, unspecified: Secondary | ICD-10-CM | POA: Diagnosis not present

## 2019-07-16 LAB — COMPREHENSIVE METABOLIC PANEL
ALT: 18 U/L (ref 0–44)
AST: 22 U/L (ref 15–41)
Albumin: 4.9 g/dL (ref 3.5–5.0)
Alkaline Phosphatase: 57 U/L (ref 38–126)
Anion gap: 12 (ref 5–15)
BUN: 14 mg/dL (ref 6–20)
CO2: 20 mmol/L — ABNORMAL LOW (ref 22–32)
Calcium: 9.9 mg/dL (ref 8.9–10.3)
Chloride: 107 mmol/L (ref 98–111)
Creatinine, Ser: 0.97 mg/dL (ref 0.44–1.00)
GFR calc Af Amer: >60 mL/min (ref >60)
GFR calc non Af Amer: >60 mL/min (ref >60)
Glucose, Bld: 93 mg/dL (ref 70–99)
Potassium: 3.3 mmol/L — ABNORMAL LOW (ref 3.5–5.1)
Sodium: 139 mmol/L (ref 135–145)
Total Bilirubin: 1.4 mg/dL — ABNORMAL HIGH (ref 0.3–1.2)
Total Protein: 8 g/dL (ref 6.5–8.1)

## 2019-07-16 LAB — URINALYSIS, MICROSCOPIC (REFLEX): RBC / HPF: >50 RBC/hpf (ref 0–5)

## 2019-07-16 LAB — CBC WITH DIFFERENTIAL/PLATELET
Abs Immature Granulocytes: 0.02 10*3/uL (ref 0.00–0.07)
Basophils Absolute: 0 10*3/uL (ref 0.0–0.1)
Basophils Relative: 0 %
Eosinophils Absolute: 0.1 10*3/uL (ref 0.0–0.5)
Eosinophils Relative: 1 %
HCT: 45.4 % (ref 36.0–46.0)
Hemoglobin: 15.4 g/dL — ABNORMAL HIGH (ref 12.0–15.0)
Immature Granulocytes: 0 %
Lymphocytes Relative: 37 %
Lymphs Abs: 3.5 10*3/uL (ref 0.7–4.0)
MCH: 32 pg (ref 26.0–34.0)
MCHC: 33.9 g/dL (ref 30.0–36.0)
MCV: 94.2 fL (ref 80.0–100.0)
Monocytes Absolute: 0.8 10*3/uL (ref 0.1–1.0)
Monocytes Relative: 9 %
Neutro Abs: 4.9 10*3/uL (ref 1.7–7.7)
Neutrophils Relative %: 53 %
Platelets: 259 10*3/uL (ref 150–400)
RBC: 4.82 MIL/uL (ref 3.87–5.11)
RDW: 11.9 % (ref 11.5–15.5)
WBC: 9.4 10*3/uL (ref 4.0–10.5)
nRBC: 0 % (ref 0.0–0.2)

## 2019-07-16 LAB — I-STAT BETA HCG BLOOD, ED (MC, WL, AP ONLY): I-stat hCG, quantitative: <5 m[IU]/mL (ref <5)

## 2019-07-16 LAB — TYPE AND SCREEN
ABO/RH(D): O POS
Antibody Screen: NEGATIVE

## 2019-07-16 LAB — WET PREP, GENITAL
Sperm: NONE SEEN
Trich, Wet Prep: NONE SEEN
Yeast Wet Prep HPF POC: NONE SEEN

## 2019-07-16 LAB — URINALYSIS, ROUTINE W REFLEX MICROSCOPIC

## 2019-07-16 LAB — ABO/RH: ABO/RH(D): O POS

## 2019-07-16 LAB — LIPASE, BLOOD: Lipase: 30 U/L (ref 11–51)

## 2019-07-16 LAB — LACTIC ACID, PLASMA: Lactic Acid, Venous: 1.5 mmol/L (ref 0.5–1.9)

## 2019-07-16 MED ORDER — ONDANSETRON HCL 4 MG/2ML IJ SOLN
4.0000 mg | Freq: Once | INTRAMUSCULAR | Status: AC
  Administered 2019-07-16: 4 mg via INTRAVENOUS
  Filled 2019-07-16: qty 2

## 2019-07-16 MED ORDER — METRONIDAZOLE 500 MG PO TABS: 500.0000 mg | ORAL_TABLET | Freq: Two times a day (BID) | ORAL | 0 refills | Status: DC

## 2019-07-16 MED ORDER — ONDANSETRON 4 MG PO TBDP
4.0000 mg | ORAL_TABLET | Freq: Once | ORAL | Status: DC
  Filled 2019-07-16: qty 1

## 2019-07-16 MED ORDER — ACETAMINOPHEN 500 MG PO TABS: 500.0000 mg | ORAL_TABLET | Freq: Four times a day (QID) | ORAL | 0 refills | Status: AC | PRN

## 2019-07-16 MED ORDER — MORPHINE SULFATE (PF) 4 MG/ML IV SOLN
4.0000 mg | Freq: Once | INTRAVENOUS | Status: AC
  Administered 2019-07-16: 4 mg via INTRAVENOUS
  Filled 2019-07-16: qty 1

## 2019-07-16 MED ORDER — SODIUM CHLORIDE 0.9 % IV BOLUS
1000.0000 mL | Freq: Once | INTRAVENOUS | Status: AC
  Administered 2019-07-16: 18:00:00 1000 mL via INTRAVENOUS

## 2019-07-16 MED ORDER — IBUPROFEN 600 MG PO TABS: 600.0000 mg | ORAL_TABLET | Freq: Four times a day (QID) | ORAL | 0 refills | Status: DC | PRN

## 2019-07-16 NOTE — Discharge Instructions (Addendum)
Continue nitrofurantoin until completed and begin taking Flagyl until completed.  Do not drink alcohol with this medication.  You will be called in 3 days if any of your tests return positive. In that case, please follow-up with OB/GYN or health department to be treated and make all of your sexual partners aware that they will need to be treated as well. Abstain from intercourse for one week until you have both been treated. Use condoms in the future to help prevent sexually transmitted disease and unwanted pregnancy.  Please follow-up with the OB/GYN for further evaluation and treatment of your vaginal bleeding.  Please return the emergency department immediately if you develop any increasing pain, bleeding, passing out, intractable vomiting, fever of 100.4, or any other concerning symptoms.

## 2019-07-16 NOTE — ED Triage Notes (Signed)
Pt here for bright red vaginal bleeding onset today along with severe lower back and lower abdominal pain. Pt not taking birth control, has not had a period in "a while."

## 2019-07-16 NOTE — ED Provider Notes (Signed)
[facility] EM[facility]GENCY [facility] Provider Note   CSN: [id number] Arrival date & time: 07/16/19  1654     History   Chief Complaint Chief Complaint  Patient presents with   Vaginal Bleeding   Back Pain    HPI Jennifer Washington is a 23 y.o. female with history of asthma who presents with onset of lower back pain and abdominal pain.  She has had associated vaginal bleeding.  She reports her back pain is radiating to her legs and is feeling like it is tingling.  She has had associated vaginal bleeding that has been like a normal period, however she states she had a normal period 2 weeks ago.  She does not currently take birth control.  Patient denies any urinary symptoms, but was started on Macrobid last week at an urgent care for "possible UTI." Patient denies any fevers, chest pain, shortness of breath.      HPI  Past Medical History:  Diagnosis Date   Asthma     There are no active problems to display for this patient.   No past surgical history on file.   OB History   No obstetric history on file.      Home Medications    Prior to Admission medications   Medication Sig Start Date End Date Taking? Authorizing Provider  acetaminophen (TYLENOL) 500 MG tablet Take 1 tablet (500 mg total) by mouth every 6 (six) hours as needed. 07/16/19   Michaelangelo Mittelman, Waylan BogaAlexandra M, PA-C  benzonatate (TESSALON) 100 MG capsule Take 1 capsule (100 mg total) by mouth every 8 (eight) hours. 05/12/17   Joy, Shawn C, PA-C  etonogestrel (NEXPLANON) 68 MG IMPL implant 1 each by Subdermal route once.    [provider]  HYDROcodone-acetaminophen (NORCO/VICODIN) 5-325 MG tablet Take 1 tablet by mouth every 4 (four) hours as needed. 09/19/18   Jacalyn LefevreHaviland, Julie, MD  ibuprofen (ADVIL) 600 MG tablet Take 1 tablet (600 mg total) by mouth every 6 (six) hours as needed. 07/16/19   Nina Hoar, Waylan BogaAlexandra M, PA-C  metroNIDAZOLE (FLAGYL) 500 MG tablet Take 1 tablet (500 mg total) by mouth 2 (two) times  daily. 07/16/19   Staphany Ditton, Waylan BogaAlexandra M, PA-C  ondansetron (ZOFRAN ODT) 4 MG disintegrating tablet Take 1 tablet (4 mg total) by mouth every 8 (eight) hours as needed for nausea or vomiting. 05/12/17   Joy, Shawn C, PA-C  ondansetron (ZOFRAN) 4 MG tablet Take 1 tablet (4 mg total) by mouth every 6 (six) hours as needed for nausea or vomiting. 11/14/18   Raeford RazorKohut, Stephen, MD    Family History No family history on file.  Social History Social History   Tobacco Use   Smoking status: Never Smoker   Smokeless tobacco: Never Used  Substance Use Topics   Alcohol use: No   Drug use: No     Allergies   Patient has no known allergies.   Review of Systems Review of Systems  Constitutional: Negative for chills and fever.  HENT: Negative for facial swelling and sore throat.   Respiratory: Negative for shortness of breath.   Cardiovascular: Negative for chest pain.  Gastrointestinal: Positive for abdominal pain, nausea and vomiting.  Genitourinary: Positive for flank pain and vaginal bleeding. Negative for dysuria and frequency.  Musculoskeletal: Positive for back pain.  Skin: Negative for rash and wound.  Neurological: Negative for headaches.  Psychiatric/Behavioral: The patient is not nervous/anxious.      Physical Exam Updated Vital Signs BP 125/63 (BP Location: Left Arm)  Pulse 79    Temp 98.1 F (36.7 C) (Oral)    Resp 18    SpO2 99%   Physical Exam Vitals signs and nursing note reviewed.  Constitutional:      General: She is not in acute distress.    Appearance: She is well-developed. She is not diaphoretic.     Comments: Hysterically crying  HENT:     Head: Normocephalic and atraumatic.     Mouth/Throat:     Pharynx: No oropharyngeal exudate.  Eyes:     General: No scleral icterus.       Right eye: No discharge.        Left eye: No discharge.     Conjunctiva/sclera: Conjunctivae normal.     Pupils: Pupils are equal, round, and reactive to light.  Neck:      Musculoskeletal: Normal range of motion and neck supple.     Thyroid: No thyromegaly.  Cardiovascular:     Rate and Rhythm: Regular rhythm. Tachycardia present.     Heart sounds: Normal heart sounds. No murmur. No friction rub. No gallop.   Pulmonary:     Effort: Pulmonary effort is normal. No respiratory distress.     Breath sounds: Normal breath sounds. No stridor. No wheezing or rales.  Abdominal:     General: Bowel sounds are normal. There is no distension.     Palpations: Abdomen is soft.     Tenderness: There is abdominal tenderness in the suprapubic area and left lower quadrant. There is no right CVA tenderness, left CVA tenderness, guarding or rebound.    Genitourinary:    Cervix: Cervical bleeding present.     Uterus: Tender.      Adnexa:        Right: No tenderness.         Left: Tenderness present.   Musculoskeletal:       Back:     Comments: Left paraspinal and midline lumbar tenderness  Lymphadenopathy:     Cervical: No cervical adenopathy.  Skin:    General: Skin is warm and dry.     Coloration: Skin is not pale.     Findings: No rash.  Neurological:     Mental Status: She is alert.     Coordination: Coordination normal.     Comments: 5/5 strength and normal sensation to bilateral lower extremities with 2+ patellar reflexes      ED Treatments / Results  Labs (all labs ordered are listed, but only abnormal results are displayed) Labs Reviewed  WET PREP, GENITAL - Abnormal; Notable for the following components:      Result Value   Clue Cells Wet Prep HPF POC PRESENT (*)    WBC, Wet Prep HPF POC FEW (*)    All other components within normal limits  URINALYSIS, ROUTINE W REFLEX MICROSCOPIC - Abnormal; Notable for the following components:   Color, Urine RED (*)    APPearance TURBID (*)    Glucose, UA   (*)    Value: TEST NOT REPORTED DUE TO COLOR INTERFERENCE OF URINE PIGMENT   Hgb urine dipstick   (*)    Value: TEST NOT REPORTED DUE TO COLOR  INTERFERENCE OF URINE PIGMENT   Bilirubin Urine   (*)    Value: TEST NOT REPORTED DUE TO COLOR INTERFERENCE OF URINE PIGMENT   Ketones, ur   (*)    Value: TEST NOT REPORTED DUE TO COLOR INTERFERENCE OF URINE PIGMENT   Protein, ur   (*)  Value: TEST NOT REPORTED DUE TO COLOR INTERFERENCE OF URINE PIGMENT   Nitrite   (*)    Value: TEST NOT REPORTED DUE TO COLOR INTERFERENCE OF URINE PIGMENT   Leukocytes,Ua   (*)    Value: TEST NOT REPORTED DUE TO COLOR INTERFERENCE OF URINE PIGMENT   All other components within normal limits  COMPREHENSIVE METABOLIC PANEL - Abnormal; Notable for the following components:   Potassium 3.3 (*)    CO2 20 (*)    Total Bilirubin 1.4 (*)    All other components within normal limits  CBC WITH DIFFERENTIAL/PLATELET - Abnormal; Notable for the following components:   Hemoglobin 15.4 (*)    All other components within normal limits  URINALYSIS, MICROSCOPIC (REFLEX) - Abnormal; Notable for the following components:   Bacteria, UA RARE (*)    All other components within normal limits  URINE CULTURE  LIPASE, BLOOD  LACTIC ACID, PLASMA  I-STAT BETA HCG BLOOD, ED ([facility], [facility], [facility] ONLY)  TYPE AND SCREEN  ABO/RH  GC/CHLAMYDIA PROBE AMP ([place]) NOT AT [facility]    EKG None  Radiology US Transvaginal Non-ob  Result Date: 07/16/2019 CLINICAL DATA:  Left lower quadrant, back pain EXAM: TRANSABDOMINAL AND TRANSVAGINAL ULTRASOUND OF PELVIS DOPPLER ULTRASOUND OF OVARIES TECHNIQUE: Both transabdominal and transvaginal ultrasound examinations of the pelvis were performed. Transabdominal technique was performed for global imaging of the pelvis including uterus, ovaries, adnexal regions, and pelvic cul-de-sac. It was necessary to proceed with endovaginal exam following the transabdominal exam to visualize the uterus, endometrium, ovaries and adnexa. Color and duplex Doppler ultrasound was utilized to evaluate blood flow to the ovaries. COMPARISON:  None. FINDINGS: Uterus  Measurements: 7.4 x 3.2 x 3.9 cm = volume: 49 mL. No fibroids or other mass visualized. Endometrium Thickness: 5 mm in thickness.  No focal abnormality visualized. Right ovary Measurements: 3.0 x 1.6 x 1.2 cm = volume: 3.0 mL. Normal appearance/no adnexal mass. Left ovary Measurements: 2.5 x 2.0 x 1.6 cm = volume: 4.3 mL. Normal appearance/no adnexal mass. Pulsed Doppler evaluation of both ovaries demonstrates normal low-resistance arterial and venous waveforms. Other findings No abnormal free fluid. IMPRESSION: Normal study. Electronically Signed   By: Charlett Nose M.D.   On: 07/16/2019 20:01   US Pelvis Complete  Result Date: 07/16/2019 CLINICAL DATA:  Left lower quadrant, back pain EXAM: TRANSABDOMINAL AND TRANSVAGINAL ULTRASOUND OF PELVIS DOPPLER ULTRASOUND OF OVARIES TECHNIQUE: Both transabdominal and transvaginal ultrasound examinations of the pelvis were performed. Transabdominal technique was performed for global imaging of the pelvis including uterus, ovaries, adnexal regions, and pelvic cul-de-sac. It was necessary to proceed with endovaginal exam following the transabdominal exam to visualize the uterus, endometrium, ovaries and adnexa. Color and duplex Doppler ultrasound was utilized to evaluate blood flow to the ovaries. COMPARISON:  None. FINDINGS: Uterus Measurements: 7.4 x 3.2 x 3.9 cm = volume: 49 mL. No fibroids or other mass visualized. Endometrium Thickness: 5 mm in thickness.  No focal abnormality visualized. Right ovary Measurements: 3.0 x 1.6 x 1.2 cm = volume: 3.0 mL. Normal appearance/no adnexal mass. Left ovary Measurements: 2.5 x 2.0 x 1.6 cm = volume: 4.3 mL. Normal appearance/no adnexal mass. Pulsed Doppler evaluation of both ovaries demonstrates normal low-resistance arterial and venous waveforms. Other findings No abnormal free fluid. IMPRESSION: Normal study. Electronically Signed   By: Charlett Nose M.D.   On: 07/16/2019 20:01   [place] Art/ven Flow Abd Pelv Doppler  Result Date:  07/16/2019 CLINICAL DATA:  Left lower quadrant, back pain  EXAM: TRANSABDOMINAL AND TRANSVAGINAL ULTRASOUND OF PELVIS DOPPLER ULTRASOUND OF OVARIES TECHNIQUE: Both transabdominal and transvaginal ultrasound examinations of the pelvis were performed. Transabdominal technique was performed for global imaging of the pelvis including uterus, ovaries, adnexal regions, and pelvic cul-de-sac. It was necessary to proceed with endovaginal exam following the transabdominal exam to visualize the uterus, endometrium, ovaries and adnexa. Color and duplex Doppler ultrasound was utilized to evaluate blood flow to the ovaries. COMPARISON:  None. FINDINGS: Uterus Measurements: 7.4 x 3.2 x 3.9 cm = volume: 49 mL. No fibroids or other mass visualized. Endometrium Thickness: 5 mm in thickness.  No focal abnormality visualized. Right ovary Measurements: 3.0 x 1.6 x 1.2 cm = volume: 3.0 mL. Normal appearance/no adnexal mass. Left ovary Measurements: 2.5 x 2.0 x 1.6 cm = volume: 4.3 mL. Normal appearance/no adnexal mass. Pulsed Doppler evaluation of both ovaries demonstrates normal low-resistance arterial and venous waveforms. Other findings No abnormal free fluid. IMPRESSION: Normal study. Electronically Signed   By: Rolm Baptise M.D.   On: 07/16/2019 20:01    Procedures Procedures (including critical care time)  Medications Ordered in ED Medications  ondansetron (ZOFRAN) injection 4 mg (4 mg Intravenous Given 07/16/19 1750)  morphine 4 MG/ML injection 4 mg (4 mg Intravenous Given 07/16/19 1730)  sodium chloride 0.9 % bolus 1,000 mL (1,000 mLs Intravenous Bolus from Bag 07/16/19 1730)     Initial Impression / Assessment and Plan / ED Course  I have reviewed the triage vital signs and the nursing notes.  Pertinent labs & imaging results that were available during my care of the patient were reviewed by me and considered in my medical decision making (see chart for details).        Patient presenting with vaginal  bleeding outside of her normal menstrual cycle as well as back pain and left lower quadrant pain.  She does have vaginal bleeding on exam.  No clots.  Bleeding to started today.  Her hemoglobin is stable.  She denies any other symptoms.  Her vitals are stable.  UA is too bloody to show much, but does show greater than 50 RBCs and 6-10 WBCs.  Urine culture sent.  We will continue Macrobid for suspected UTI as diagnosed at urgent care.  Unable to view fast med UA results.  Ultrasound is negative.  Labs are unremarkable.  hCG negative.  Wet prep shows clue cells.  GC/chlamydia sent and pending.  No etiology for patient's pain at this time except for possible UTI and pain from vaginal bleeding.  No emergent intervention for bleeding at this time.  Patient to follow-up with OB/GYN, given resources.  Strict return precautions given including worsening pain, vomiting, passing out, or any other concerning symptoms.  Patient understands and agrees with plan.  Patient vital stable throughout ED course and discharged in satisfactory condition. I discussed patient case with Dr. Langston Masker who guided the patient's management and agrees with plan.   Final Clinical Impressions(s) / ED Diagnoses   Final diagnoses:  Vaginal bleeding  Left lower quadrant abdominal pain  Acute left-sided low back pain, unspecified whether sciatica present    ED Discharge Orders         Ordered    metroNIDAZOLE (FLAGYL) 500 MG tablet  2 times daily     07/16/19 2223    ibuprofen (ADVIL) 600 MG tablet  Every 6 hours PRN     07/16/19 2223    acetaminophen (TYLENOL) 500 MG tablet  Every 6 hours PRN  07/16/19 2223           Emi Holes, PA-C 07/16/19 2231    Terald Sleeper, MD 07/17/19 [phone]

## 2019-07-16 NOTE — ED Notes (Signed)
Pelvic cart at bedside. 

## 2019-07-16 NOTE — ED Notes (Signed)
Pelvic cart ready and at bedside.

## 2019-07-17 LAB — URINE CULTURE: Culture: NO GROWTH

## 2019-07-20 LAB — GC/CHLAMYDIA PROBE AMP (~~LOC~~) NOT AT ARMC
Chlamydia: POSITIVE — AB
Neisseria Gonorrhea: NEGATIVE

## 2019-07-21 ENCOUNTER — Telehealth: Payer: Self-pay | Admitting: Student

## 2019-07-21 DIAGNOSIS — A749 Chlamydial infection, unspecified: Secondary | ICD-10-CM

## 2019-07-21 MED ORDER — AZITHROMYCIN 500 MG PO TABS: 1000.0000 mg | ORAL_TABLET | Freq: Once | ORAL | 0 refills | Status: AC

## 2019-07-21 NOTE — Telephone Encounter (Addendum)
Falesha Chanthavong tested positive for  Chlamydia. Patient was called by RN and allergies and pharmacy confirmed. Rx sent to pharmacy of choice.   Jorje Guild, NP 07/21/2019 3:29 PM       ----- Message from Bjorn Loser, RN sent at 07/21/2019  3:17 PM EST ----- This patient tested positive for:  "Chlamydia"  She :"has NKDA", I have informed the patient of her results and confirmed her pharmacy is correct in her chart. Please send Rx.   Thank you,   Bjorn Loser, RN   Results faxed to [facility] Department.

## 2019-08-19 ENCOUNTER — Emergency Department (HOSPITAL_COMMUNITY): Payer: Medicaid - Out of State

## 2019-08-19 ENCOUNTER — Encounter (HOSPITAL_COMMUNITY): Payer: Self-pay | Admitting: Emergency Medicine

## 2019-08-19 ENCOUNTER — Other Ambulatory Visit: Payer: Self-pay

## 2019-08-19 ENCOUNTER — Emergency Department (HOSPITAL_COMMUNITY)
Admission: EM | Admit: 2019-08-19 | Discharge: 2019-08-19 | Disposition: A | Discharge status: Home / Self Care | Payer: Medicaid - Out of State | Attending: Emergency Medicine | Admitting: Emergency Medicine

## 2019-08-19 DIAGNOSIS — J029 Acute pharyngitis, unspecified: Secondary | ICD-10-CM | POA: Insufficient documentation

## 2019-08-19 DIAGNOSIS — F1290 Cannabis use, unspecified, uncomplicated: Secondary | ICD-10-CM | POA: Diagnosis not present

## 2019-08-19 DIAGNOSIS — R1110 Vomiting, unspecified: Secondary | ICD-10-CM

## 2019-08-19 DIAGNOSIS — K2970 Gastritis, unspecified, without bleeding: Secondary | ICD-10-CM | POA: Insufficient documentation

## 2019-08-19 LAB — COMPREHENSIVE METABOLIC PANEL
ALT: 45 U/L — ABNORMAL HIGH (ref 0–44)
AST: 36 U/L (ref 15–41)
Albumin: 4.5 g/dL (ref 3.5–5.0)
Alkaline Phosphatase: 59 U/L (ref 38–126)
Anion gap: 12 (ref 5–15)
BUN: 18 mg/dL (ref 6–20)
CO2: 23 mmol/L (ref 22–32)
Calcium: 9.4 mg/dL (ref 8.9–10.3)
Chloride: 106 mmol/L (ref 98–111)
Creatinine, Ser: 0.99 mg/dL (ref 0.44–1.00)
GFR calc Af Amer: >60 mL/min (ref >60)
GFR calc non Af Amer: >60 mL/min (ref >60)
Glucose, Bld: 82 mg/dL (ref 70–99)
Potassium: 3.6 mmol/L (ref 3.5–5.1)
Sodium: 141 mmol/L (ref 135–145)
Total Bilirubin: 1 mg/dL (ref 0.3–1.2)
Total Protein: 7.7 g/dL (ref 6.5–8.1)

## 2019-08-19 LAB — CBC WITH DIFFERENTIAL/PLATELET
Abs Immature Granulocytes: 0.01 10*3/uL (ref 0.00–0.07)
Basophils Absolute: 0 10*3/uL (ref 0.0–0.1)
Basophils Relative: 0 %
Eosinophils Absolute: 0 10*3/uL (ref 0.0–0.5)
Eosinophils Relative: 0 %
HCT: 43.5 % (ref 36.0–46.0)
Hemoglobin: 14.7 g/dL (ref 12.0–15.0)
Immature Granulocytes: 0 %
Lymphocytes Relative: 39 %
Lymphs Abs: 2.3 10*3/uL (ref 0.7–4.0)
MCH: 32.1 pg (ref 26.0–34.0)
MCHC: 33.8 g/dL (ref 30.0–36.0)
MCV: 95 fL (ref 80.0–100.0)
Monocytes Absolute: 0.7 10*3/uL (ref 0.1–1.0)
Monocytes Relative: 12 %
Neutro Abs: 2.8 10*3/uL (ref 1.7–7.7)
Neutrophils Relative %: 49 %
Platelets: 226 10*3/uL (ref 150–400)
RBC: 4.58 MIL/uL (ref 3.87–5.11)
RDW: 12.4 % (ref 11.5–15.5)
WBC: 5.7 10*3/uL (ref 4.0–10.5)
nRBC: 0 % (ref 0.0–0.2)

## 2019-08-19 LAB — URINALYSIS, ROUTINE W REFLEX MICROSCOPIC
Bilirubin Urine: NEGATIVE
Glucose, UA: NEGATIVE mg/dL
Hgb urine dipstick: NEGATIVE
Ketones, ur: 80 mg/dL — AB
Nitrite: NEGATIVE
Protein, ur: 100 mg/dL — AB
Specific Gravity, Urine: 1.033 — ABNORMAL HIGH (ref 1.005–1.030)
pH: 6 (ref 5.0–8.0)

## 2019-08-19 LAB — GROUP A STREP BY PCR: Group A Strep by PCR: NOT DETECTED

## 2019-08-19 LAB — LIPASE, BLOOD: Lipase: 25 U/L (ref 11–51)

## 2019-08-19 LAB — POC URINE PREG, ED: Preg Test, Ur: NEGATIVE

## 2019-08-19 MED ORDER — FAMOTIDINE 20 MG PO TABS: 20.0000 mg | ORAL_TABLET | Freq: Two times a day (BID) | ORAL | 0 refills | Status: AC

## 2019-08-19 MED ORDER — ONDANSETRON 4 MG PO TBDP: 4.0000 mg | ORAL_TABLET | Freq: Three times a day (TID) | ORAL | 0 refills | Status: DC | PRN

## 2019-08-19 MED ORDER — SODIUM CHLORIDE 0.9 % IV BOLUS
1000.0000 mL | Freq: Once | INTRAVENOUS | Status: AC
  Administered 2019-08-19: 1000 mL via INTRAVENOUS

## 2019-08-19 MED ORDER — ONDANSETRON HCL 4 MG/2ML IJ SOLN
4.0000 mg | Freq: Once | INTRAMUSCULAR | Status: AC
  Administered 2019-08-19: 4 mg via INTRAVENOUS
  Filled 2019-08-19: qty 2

## 2019-08-19 NOTE — ED Provider Notes (Signed)
[facility] EMERGENCY [facility]PART[facility] Provider Note   CSN: [id number] Arrival date & time: 08/19/19  1455     History Chief Complaint  Patient presents with  . Vomiting    Jennifer Washington is a 23 y.o. female.  [age] year old female with history of asthma, otherwise healthy, presents for evaluation of persistent vomiting.  Patient reports she has not been able to eat or drink for a week because she vomits every time she tries to eat.  She has been able to tolerate some small slips of fluids.  Reports the emesis occurs right after she tries to eat.  She has not had fever or diarrhea.  Seen last month for irregular vaginal bleeding and diagnosed with chlamydia and was appropriately treated.  She denies any abdominal pain.  No vaginal discharge.  She does report she is sexually active but uses protection.  LMP was 1 week ago just ended 4 days ago.  Despite her frequent vomiting, she reports that she is urinating 4-5 times per day.  She reports normal daily bowel movements.  No constipation.  No blood in stools.  No dysuria.  Sick contacts include her boyfriend who was negative for Covid but positive for flu.  Patient reports she has had mild sore throat for 2 to 3 days as well.  Additionally, she endorses marijuana use several times per week but denies that she uses this daily.  No prior history of abdominal surgeries.  The history is provided by the patient.       Past Medical History:  Diagnosis Date  . Asthma     There are no problems to display for this patient.   No past surgical history on file.   OB History   No obstetric history on file.     No family history on file.  Social History   Tobacco Use  . Smoking status: Never Smoker  . Smokeless tobacco: Never Used  Substance Use Topics  . Alcohol use: No  . Drug use: No    Home Medications Prior to Admission medications   Medication Sig Start Date End Date Taking? Authorizing Provider    acetaminophen (TYLENOL) 500 MG tablet Take 1 tablet (500 mg total) by mouth every 6 (six) hours as needed. 07/16/19   Law, Waylan BogaAlexandra M, PA-C  benzonatate (TESSALON) 100 MG capsule Take 1 capsule (100 mg total) by mouth every 8 (eight) hours. 05/12/17   Joy, Shawn C, PA-C  etonogestrel (NEXPLANON) 68 MG IMPL implant 1 each by Subdermal route once.    [provider]  famotidine (PEPCID) 20 MG tablet Take 1 tablet (20 mg total) by mouth 2 (two) times daily for 7 days. Then as needed for reflux/heartburn symptoms 08/19/19 08/26/19  Ree Shayeis, Tamar Lipscomb, MD  HYDROcodone-acetaminophen (NORCO/VICODIN) 5-325 MG tablet Take 1 tablet by mouth every 4 (four) hours as needed. 09/19/18   Jacalyn LefevreHaviland, Julie, MD  ibuprofen (ADVIL) 600 MG tablet Take 1 tablet (600 mg total) by mouth every 6 (six) hours as needed. 07/16/19   Law, Waylan BogaAlexandra M, PA-C  metroNIDAZOLE (FLAGYL) 500 MG tablet Take 1 tablet (500 mg total) by mouth 2 (two) times daily. 07/16/19   Law, Waylan BogaAlexandra M, PA-C  ondansetron (ZOFRAN ODT) 4 MG disintegrating tablet Take 1 tablet (4 mg total) by mouth every 8 (eight) hours as needed for nausea or vomiting. 08/19/19   Ree Shayeis, Nikoloz Huy, MD  ondansetron (ZOFRAN) 4 MG tablet Take 1 tablet (4 mg total) by mouth every 6 (six) hours as needed for  nausea or vomiting. 11/14/18   Raeford Razor, MD    Allergies    Patient has no known allergies.  Review of Systems   Review of Systems  All systems reviewed and were reviewed and were negative except as stated in the HPI  Physical Exam Updated Vital Signs BP 133/83 (BP Location: Left Arm)   Pulse (!) 105   Temp 98.6 F (37 C) (Oral)   Resp 16   SpO2 100%   Physical Exam Vitals and nursing note reviewed.  Constitutional:      General: She is not in acute distress.    Appearance: Normal appearance. She is well-developed.     Comments: Awake alert with normal mental status  HENT:     Head: Normocephalic and atraumatic.     Right Ear: Tympanic membrane normal.      Left Ear: Tympanic membrane normal.     Mouth/Throat:     Mouth: Mucous membranes are moist.     Pharynx: Oropharyngeal exudate and posterior oropharyngeal erythema present.     Comments: Tonsils 2+ with bilateral exudates, throat erythematous, uvula midline Eyes:     Conjunctiva/sclera: Conjunctivae normal.     Pupils: Pupils are equal, round, and reactive to light.  Cardiovascular:     Rate and Rhythm: Normal rate and regular rhythm.     Heart sounds: Normal heart sounds. No murmur. No friction rub. No gallop.   Pulmonary:     Effort: Pulmonary effort is normal. No respiratory distress.     Breath sounds: No wheezing or rales.  Abdominal:     General: Bowel sounds are normal.     Palpations: Abdomen is soft.     Tenderness: There is no abdominal tenderness. There is no guarding or rebound.     Comments: Soft and nontender, no guarding, no right lower quadrant tenderness  Musculoskeletal:        General: No tenderness. Normal range of motion.     Cervical back: Normal range of motion and neck supple.  Skin:    General: Skin is warm and dry.     Capillary Refill: Capillary refill takes less than 2 seconds.     Findings: No rash.  Neurological:     General: No focal deficit present.     Mental Status: She is alert and oriented to person, place, and time.     Cranial Nerves: No cranial nerve deficit.     Motor: No weakness.     Gait: Gait normal.     Comments: Normal strength 5/5 in upper and lower extremities, normal coordination     ED Results / Procedures / Treatments   Labs (all labs ordered are listed, but only abnormal results are displayed) Labs Reviewed  COMPREHENSIVE METABOLIC PANEL - Abnormal; Notable for the following components:      Result Value   ALT 45 (*)    All other components within normal limits  URINALYSIS, ROUTINE W REFLEX MICROSCOPIC - Abnormal; Notable for the following components:   APPearance HAZY (*)    Specific Gravity, Urine 1.033 (*)     Ketones, ur 80 (*)    Protein, ur 100 (*)    Leukocytes,Ua TRACE (*)    Bacteria, UA MANY (*)    All other components within normal limits  GROUP A STREP BY PCR  URINE CULTURE  CBC WITH DIFFERENTIAL/PLATELET  LIPASE, BLOOD  POC URINE PREG, ED    EKG None  Radiology DG Abd 2 Views  Result Date: 08/19/2019  CLINICAL DATA:  Generalized abdominal pressure with nausea vomiting for 2 days EXAM: ABDOMEN - 2 VIEW COMPARISON:  CT 09/19/2018 FINDING: Numerous loops of air-filled, nondistended bowel throughout the abdomen with additional gas outlining the rugal folds of the stomach with mild rugal thickening. No worrisome calcifications over the urinary tract or gallbladder fossa. Osseous structures are unremarkable. Lung bases are clear. IMPRESSION: Nonspecific air-filled loops of bowel without high-grade obstructive pattern. Rugal fold thickening outlined by gastric air. Could reflect features of gastritis. Electronically Signed   By: Lovena Le M.D.   On: 08/19/2019 19:40    Procedures Procedures (including critical care time)  Medications Ordered in ED Medications  sodium chloride 0.9 % bolus 1,000 mL (0 mLs Intravenous Stopped 08/19/19 1945)  ondansetron (ZOFRAN) injection 4 mg (4 mg Intravenous Given 08/19/19 1843)    ED Course  I have reviewed the triage vital signs and the nursing notes.  Pertinent labs & imaging results that were available during my care of the patient were reviewed by me and considered in my medical decision making (see chart for details).    MDM Rules/Calculators/A&P                      23 year old female with history of asthma presents with persistent vomiting for 1 week.  History of marijuana use but reports she does not use it daily.  No abdominal pain.  Recently seen last month for irregular vaginal bleeding and tested positive for chlamydia and was appropriately treated for both chlamydia and BV.  Patient also reporting sore throat for 2 to 3 days.  No  fevers.  No diarrhea.  Her boyfriend reportedly diagnosed with flu this week.  On exam here she is afebrile, heart rate mildly elevated at 105, all other vitals normal.  She is well-appearing and does not appear clinically dehydrated on exam.  Throat erythematous with bilateral exudates, lungs clear, abdomen soft and nontender without guarding.  We will send strep PCR.  Will place saline lock and give IV fluid bolus along with IV Zofran given her persistent vomiting.  Will check screening labs to include CBC CMP and lipase.  She does not have any focal right lower quadrant pain, no concerns for appendicitis or abdominal emergency at this time but given her persistent emesis will obtain two-view abdominal x-rays to assess her overall bowel gas pattern ensure no signs of obstruction.  Urine pregnancy negative.  Urinalysis clear.  CBC reassuring with white blood cell count 5700, no left shift.  H&H and platelets normal.  CMP unremarkable (normal BUN and Cr) and lipase normal.  Two-view abdominal x-rays show normal bowel gas pattern without signs of obstruction.  Radiology did note rugal fold thickening which could reflect gastritis.  I personally reviewed these x-rays.  After IV fluid bolus and Zofran, patient feels much better.  She tolerated a 6 ounce Gatorade trial and small sips without vomiting.  Abdomen remains benign.  We will start her on a 7-day course of Pepcid 20 mg twice daily for gastritis.  Will provide Zofran for as needed use.  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  Advise no further use of marijuana.  Advise close follow-up with PCP next week after the Christmas holiday with return precautions as outlined in the discharge instructions.   Final Clinical Impression(s) / ED Diagnoses Final diagnoses:  Vomiting  Gastritis without bleeding, unspecified chronicity, unspecified gastritis type    Rx / DC  Orders ED Discharge Orders          Ordered    ondansetron (ZOFRAN ODT) 4 MG disintegrating tablet  Every 8 hours PRN     08/19/19 2058    famotidine (PEPCID) 20 MG tablet  2 times daily     08/19/19 2058           Ree Shay, MD 08/19/19 2102

## 2019-08-19 NOTE — ED Notes (Signed)
Pt states she has not been able to eat or drink for a week. She states she was tested a month ago for covid and it was negative. Up to the restroom to give urine sample

## 2019-08-19 NOTE — Discharge Instructions (Signed)
Your lab work, urine studies, and abdominal x-ray were all reassuring today.  You may take Zofran 1 dissolving tablet every 6-8 hours as needed for nausea.  Take frequent small sips of clear fluids like water Gatorade or Powerade.  Avoid orange juice.  Slowly progress to bland diet as tolerated.  No greasy fried or fatty foods.  Start the Pepcid and take 1 tablet twice daily for 7 days.  If symptoms have resolved you can stop at that time.  If still having symptoms, would continue the Pepcid twice daily as needed.  As we discussed, marijuana use can cause recurrent vomiting syndromes in young adults.  Would avoid use of marijuana.  Follow-up with your regular doctor next week after the holiday.  Return sooner for worsening vomiting, blood in vomit, severe abdominal pain, passing out spells, no urine output over 12 hours or new concerns.

## 2019-08-19 NOTE — ED Notes (Signed)
Patient drank water °

## 2019-08-19 NOTE — ED Notes (Signed)
ED Provider at bedside. 

## 2019-08-22 LAB — URINE CULTURE
Culture: >=100000 — AB
Special Requests: NORMAL

## 2019-10-07 ENCOUNTER — Encounter (HOSPITAL_COMMUNITY): Payer: Self-pay | Admitting: Emergency Medicine

## 2019-10-07 ENCOUNTER — Emergency Department (HOSPITAL_COMMUNITY)
Admission: EM | Admit: 2019-10-07 | Discharge: 2019-10-07 | Disposition: A | Discharge status: Home / Self Care | Payer: Medicaid - Out of State | Attending: Emergency Medicine | Admitting: Emergency Medicine

## 2019-10-07 ENCOUNTER — Other Ambulatory Visit: Payer: Self-pay

## 2019-10-07 DIAGNOSIS — R109 Unspecified abdominal pain: Secondary | ICD-10-CM | POA: Insufficient documentation

## 2019-10-07 DIAGNOSIS — Z5321 Procedure and treatment not carried out due to patient leaving prior to being seen by health care provider: Secondary | ICD-10-CM | POA: Insufficient documentation

## 2019-10-07 LAB — COMPREHENSIVE METABOLIC PANEL
ALT: 13 U/L (ref 0–44)
AST: 19 U/L (ref 15–41)
Albumin: 4.1 g/dL (ref 3.5–5.0)
Alkaline Phosphatase: 62 U/L (ref 38–126)
Anion gap: 9 (ref 5–15)
BUN: 12 mg/dL (ref 6–20)
CO2: 25 mmol/L (ref 22–32)
Calcium: 9.4 mg/dL (ref 8.9–10.3)
Chloride: 104 mmol/L (ref 98–111)
Creatinine, Ser: 0.84 mg/dL (ref 0.44–1.00)
GFR calc Af Amer: >60 mL/min (ref >60)
GFR calc non Af Amer: >60 mL/min (ref >60)
Glucose, Bld: 79 mg/dL (ref 70–99)
Potassium: 4.3 mmol/L (ref 3.5–5.1)
Sodium: 138 mmol/L (ref 135–145)
Total Bilirubin: 0.7 mg/dL (ref 0.3–1.2)
Total Protein: 7.1 g/dL (ref 6.5–8.1)

## 2019-10-07 LAB — CBC
HCT: 42.5 % (ref 36.0–46.0)
Hemoglobin: 13.7 g/dL (ref 12.0–15.0)
MCH: 31.8 pg (ref 26.0–34.0)
MCHC: 32.2 g/dL (ref 30.0–36.0)
MCV: 98.6 fL (ref 80.0–100.0)
Platelets: 257 10*3/uL (ref 150–400)
RBC: 4.31 MIL/uL (ref 3.87–5.11)
RDW: 12.8 % (ref 11.5–15.5)
WBC: 7.3 10*3/uL (ref 4.0–10.5)
nRBC: 0 % (ref 0.0–0.2)

## 2019-10-07 LAB — I-STAT BETA HCG BLOOD, ED (MC, WL, AP ONLY): I-stat hCG, quantitative: <5 m[IU]/mL (ref <5)

## 2019-10-07 LAB — LIPASE, BLOOD: Lipase: 34 U/L (ref 11–51)

## 2019-10-07 MED ORDER — SODIUM CHLORIDE 0.9% FLUSH: 3.0000 mL | Freq: Once | INTRAVENOUS | Status: DC

## 2019-10-07 NOTE — ED Triage Notes (Signed)
Pt c/o abdominal pain and nausea. Diagnosed with gastritis and has been unable to get her prescriptions filled. Denies urinary symptoms.

## 2019-10-07 NOTE — ED Notes (Signed)
Called x2, no ans. Will try one more time, in a few mins

## 2019-11-04 ENCOUNTER — Emergency Department (HOSPITAL_COMMUNITY): Payer: No Typology Code available for payment source

## 2019-11-04 ENCOUNTER — Emergency Department (HOSPITAL_COMMUNITY)
Admission: EM | Admit: 2019-11-04 | Discharge: 2019-11-04 | Disposition: A | Discharge status: Home / Self Care | Payer: No Typology Code available for payment source | Attending: Emergency Medicine | Admitting: Emergency Medicine

## 2019-11-04 ENCOUNTER — Other Ambulatory Visit: Payer: Self-pay

## 2019-11-04 ENCOUNTER — Encounter (HOSPITAL_COMMUNITY): Payer: Self-pay

## 2019-11-04 DIAGNOSIS — S93401A Sprain of unspecified ligament of right ankle, initial encounter: Secondary | ICD-10-CM | POA: Insufficient documentation

## 2019-11-04 DIAGNOSIS — T07XXXA Unspecified multiple injuries, initial encounter: Secondary | ICD-10-CM | POA: Diagnosis present

## 2019-11-04 DIAGNOSIS — Z79899 Other long term (current) drug therapy: Secondary | ICD-10-CM | POA: Diagnosis not present

## 2019-11-04 DIAGNOSIS — Y929 Unspecified place or not applicable: Secondary | ICD-10-CM | POA: Diagnosis not present

## 2019-11-04 DIAGNOSIS — S8001XA Contusion of right knee, initial encounter: Secondary | ICD-10-CM

## 2019-11-04 DIAGNOSIS — Y939 Activity, unspecified: Secondary | ICD-10-CM | POA: Insufficient documentation

## 2019-11-04 DIAGNOSIS — Y999 Unspecified external cause status: Secondary | ICD-10-CM | POA: Insufficient documentation

## 2019-11-04 MED ORDER — METHOCARBAMOL 500 MG PO TABS: 500.0000 mg | ORAL_TABLET | Freq: Two times a day (BID) | ORAL | 0 refills | Status: DC
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
## 2020-11-01 ENCOUNTER — Encounter (HOSPITAL_COMMUNITY): Payer: Self-pay

## 2020-11-01 ENCOUNTER — Other Ambulatory Visit: Payer: Self-pay

## 2020-11-01 ENCOUNTER — Emergency Department (HOSPITAL_COMMUNITY)
Admission: EM | Admit: 2020-11-01 | Discharge: 2020-11-01 | Disposition: A | Discharge status: Home / Self Care | Payer: Medicaid Other | Attending: Emergency Medicine | Admitting: Emergency Medicine

## 2020-11-01 DIAGNOSIS — Z3201 Encounter for pregnancy test, result positive: Secondary | ICD-10-CM | POA: Diagnosis not present

## 2020-11-01 DIAGNOSIS — J45909 Unspecified asthma, uncomplicated: Secondary | ICD-10-CM | POA: Diagnosis not present

## 2020-11-01 LAB — URINALYSIS, ROUTINE W REFLEX MICROSCOPIC
Bilirubin Urine: NEGATIVE
Glucose, UA: NEGATIVE mg/dL
Hgb urine dipstick: NEGATIVE
Ketones, ur: 5 mg/dL — AB
Nitrite: NEGATIVE
Protein, ur: NEGATIVE mg/dL
Specific Gravity, Urine: 1.026 (ref 1.005–1.030)
pH: 6 (ref 5.0–8.0)

## 2020-11-01 LAB — POC URINE PREG, ED: Preg Test, Ur: POSITIVE — AB

## 2020-11-01 NOTE — ED Triage Notes (Signed)
Pt states she took a pregnancy test today and it was + and she wants to confirm it

## 2020-11-01 NOTE — Discharge Instructions (Addendum)
You were seen in the emergency department today for a positive pregnancy test-your test today was positive.  Please start taking prenatal vitamins if you wish to continue with pregnancy.  Please follow-up with OB/GYN within 1 week to establish care and for further management.  Return to the ER or the MAU  or new or worsening symptoms including but not limited to pelvic/abdominal pain, vaginal bleeding, fever, burning with urination, or any other concerns.

## 2020-11-01 NOTE — ED Provider Notes (Signed)
[facility] EMERGENCY DEPARTMENT Provider Note   CSN: [id number] Arrival date & time: 11/01/20  0018     History Chief Complaint  Patient presents with  . Possible Pregnancy    Jennifer Washington is a 25 y.o. female with a hx of asthma who presents to the ED requesting confirmatory pregnancy test. Patient states she had a positive pregnancy test at home, LMP was end of January. She is asymptomatic- denies pain, vaginal bleeding/discharge, dysuria, or vomiting. No alleviating/aggravating factors. Has never been pregnant before. She is not currently taking any medications.   HPI     Past Medical History:  Diagnosis Date  . Asthma     There are no problems to display for this patient.   History reviewed. No pertinent surgical history.   OB History   No obstetric history on file.     No family history on file.  Social History   Tobacco Use  . Smoking status: Never Smoker  . Smokeless tobacco: Never Used  Substance Use Topics  . Alcohol use: No  . Drug use: No    Home Medications Prior to Admission medications   Medication Sig Start Date End Date Taking? Authorizing Provider  acetaminophen (TYLENOL) 500 MG tablet Take 1 tablet (500 mg total) by mouth every 6 (six) hours as needed. 07/16/19   Law, Waylan Boga, PA-C  benzonatate (TESSALON) 100 MG capsule Take 1 capsule (100 mg total) by mouth every 8 (eight) hours. 05/12/17   Joy, Shawn C, PA-C  etonogestrel (NEXPLANON) 68 MG IMPL implant 1 each by Subdermal route once.    [provider]  famotidine (PEPCID) 20 MG tablet Take 1 tablet (20 mg total) by mouth 2 (two) times daily for 7 days. Then as needed for reflux/heartburn symptoms 08/19/19 08/26/19  Ree Shay, MD  HYDROcodone-acetaminophen (NORCO/VICODIN) 5-325 MG tablet Take 1 tablet by mouth every 4 (four) hours as needed. 09/19/18   Jacalyn Lefevre, MD  ibuprofen (ADVIL) 600 MG tablet Take 1 tablet (600 mg total) by mouth every 6 (six) hours  as needed. 07/16/19   Law, Waylan Boga, PA-C  methocarbamol (ROBAXIN) 500 MG tablet Take 1 tablet (500 mg total) by mouth 2 (two) times daily. 11/04/19   Khatri, Hina, PA-C  metroNIDAZOLE (FLAGYL) 500 MG tablet Take 1 tablet (500 mg total) by mouth 2 (two) times daily. 07/16/19   Law, Waylan Boga, PA-C  ondansetron (ZOFRAN ODT) 4 MG disintegrating tablet Take 1 tablet (4 mg total) by mouth every 8 (eight) hours as needed for nausea or vomiting. 08/19/19   Ree Shay, MD  ondansetron (ZOFRAN) 4 MG tablet Take 1 tablet (4 mg total) by mouth every 6 (six) hours as needed for nausea or vomiting. 11/14/18   Raeford Razor, MD    Allergies    Patient has no known allergies.  Review of Systems   Review of Systems  Constitutional: Negative for chills and fever.  Respiratory: Negative for shortness of breath.   Cardiovascular: Negative for chest pain.  Gastrointestinal: Negative for abdominal pain, nausea and vomiting.  Genitourinary: Negative for dysuria, pelvic pain, vaginal bleeding and vaginal discharge.  All other systems reviewed and are negative.   Physical Exam Updated Vital Signs BP 130/80 (BP Location: Left Arm)   Pulse 84   Temp 98.7 F (37.1 C) (Oral)   Resp 16   SpO2 100%   Physical Exam Vitals and nursing note reviewed.  Constitutional:      General: She is not in acute  distress.    Appearance: She is well-developed. She is not toxic-appearing.  HENT:     Head: Normocephalic and atraumatic.  Eyes:     General:        Right eye: No discharge.        Left eye: No discharge.     Conjunctiva/sclera: Conjunctivae normal.  Cardiovascular:     Rate and Rhythm: Normal rate and regular rhythm.  Pulmonary:     Effort: Pulmonary effort is normal. No respiratory distress.     Breath sounds: Normal breath sounds. No wheezing, rhonchi or rales.  Abdominal:     General: There is no distension.     Palpations: Abdomen is soft.     Tenderness: There is no abdominal tenderness.  There is no guarding or rebound.  Musculoskeletal:     Cervical back: Neck supple.  Skin:    General: Skin is warm and dry.     Findings: No rash.  Neurological:     Mental Status: She is alert.     Comments: Clear speech.   Psychiatric:        Behavior: Behavior normal.     ED Results / Procedures / Treatments   Labs (all labs ordered are listed, but only abnormal results are displayed) Labs Reviewed  URINALYSIS, ROUTINE W REFLEX MICROSCOPIC - Abnormal; Notable for the following components:      Result Value   APPearance HAZY (*)    Ketones, ur 5 (*)    Leukocytes,Ua TRACE (*)    Bacteria, UA RARE (*)    All other components within normal limits  POC URINE PREG, ED - Abnormal; Notable for the following components:   Preg Test, Ur POSITIVE (*)    All other components within normal limits  URINE CULTURE    EKG None  Radiology No results found.  Procedures Procedures   Medications Ordered in ED Medications - No data to display  ED Course  I have reviewed the triage vital signs and the nursing notes.  Pertinent labs & imaging results that were available during my care of the patient were reviewed by me and considered in my medical decision making (see chart for details).    MDM Rules/Calculators/A&P                          Patient presents to the ED requesting confirmatory pregnancy test after positive at home test. Asymptomatic. Nontoxic, vitals WNL. Benign exam.   Additional history obtained:  Additional history obtained from chart review & nursing note review.   Lab Tests:  I reviewed and interpreted labs, which included:  Preg test: positive UA: Trace leukocytes and rare bacteria present, however patient does have contamination with 11-20 squamous epithelial cells, will hold off on asymptomatic bacteria treatment in pregnancy given contamination and will send for culture. She has no urinary symptoms.  Positive pregnancy test. No complaints of  pelvic/abdominal pain, vaginal bleeding, vaginal discharge, or dysuria. No indication for emergent pelvic exam or ultrasound today. Patient is excited about pregnancy. I have advised her to start taking prenatal vitamins and to follow-up with OB/GYN, follow-up information provided. I discussed results, treatment plan, need for follow-up, and return precautions with the patient. Provided opportunity for questions, patient confirmed understanding and is in agreement with plan.   Portions of this note were generated with Scientist, clinical (histocompatibility and immunogenetics). Dictation errors may occur despite best attempts at proofreading.  Final Clinical Impression(s) / ED Diagnoses Final diagnoses:  Positive pregnancy test    Rx / DC Orders ED Discharge Orders    None       Cherly Anderson, PA-C 11/01/20 0146    Marily Memos, MD 11/01/20 0345

## 2020-11-04 LAB — URINE CULTURE: Culture: >=100000 — AB

## 2020-11-09 ENCOUNTER — Other Ambulatory Visit: Payer: Self-pay

## 2020-11-09 ENCOUNTER — Encounter (HOSPITAL_COMMUNITY): Payer: Self-pay | Admitting: Obstetrics and Gynecology

## 2020-11-09 ENCOUNTER — Inpatient Hospital Stay (HOSPITAL_COMMUNITY)
Admission: AD | Admit: 2020-11-09 | Discharge: 2020-11-09 | Disposition: A | Discharge status: Home / Self Care | Payer: Medicaid Other | Attending: Obstetrics and Gynecology | Admitting: Obstetrics and Gynecology

## 2020-11-09 ENCOUNTER — Inpatient Hospital Stay (HOSPITAL_COMMUNITY): Payer: Medicaid Other

## 2020-11-09 DIAGNOSIS — O26899 Other specified pregnancy related conditions, unspecified trimester: Secondary | ICD-10-CM

## 2020-11-09 DIAGNOSIS — O26891 Other specified pregnancy related conditions, first trimester: Secondary | ICD-10-CM | POA: Insufficient documentation

## 2020-11-09 DIAGNOSIS — O418X90 Other specified disorders of amniotic fluid and membranes, unspecified trimester, not applicable or unspecified: Secondary | ICD-10-CM

## 2020-11-09 DIAGNOSIS — Z3A01 Less than 8 weeks gestation of pregnancy: Secondary | ICD-10-CM | POA: Diagnosis not present

## 2020-11-09 DIAGNOSIS — Z79899 Other long term (current) drug therapy: Secondary | ICD-10-CM | POA: Diagnosis not present

## 2020-11-09 DIAGNOSIS — R109 Unspecified abdominal pain: Secondary | ICD-10-CM | POA: Insufficient documentation

## 2020-11-09 DIAGNOSIS — O208 Other hemorrhage in early pregnancy: Secondary | ICD-10-CM | POA: Insufficient documentation

## 2020-11-09 DIAGNOSIS — W19XXXA Unspecified fall, initial encounter: Secondary | ICD-10-CM

## 2020-11-09 DIAGNOSIS — O9A212 Injury, poisoning and certain other consequences of external causes complicating pregnancy, second trimester: Secondary | ICD-10-CM | POA: Insufficient documentation

## 2020-11-09 LAB — URINALYSIS, ROUTINE W REFLEX MICROSCOPIC
Bilirubin Urine: NEGATIVE
Glucose, UA: NEGATIVE mg/dL
Hgb urine dipstick: NEGATIVE
Ketones, ur: NEGATIVE mg/dL
Nitrite: NEGATIVE
Protein, ur: NEGATIVE mg/dL
Specific Gravity, Urine: 1.026 (ref 1.005–1.030)
pH: 7 (ref 5.0–8.0)

## 2020-11-09 MED ORDER — ACETAMINOPHEN 500 MG PO TABS
1000.0000 mg | ORAL_TABLET | Freq: Once | ORAL | Status: AC
  Administered 2020-11-09: 1000 mg via ORAL
  Filled 2020-11-09: qty 2

## 2020-11-09 NOTE — MAU Provider Note (Addendum)
History     CSN: [id number]  Arrival date and time: 11/09/20 1630   Event Date/Time   First Provider Initiated Contact with Patient 11/09/20 1750      Chief Complaint  Patient presents with   Abdominal Pain   HPI   Ms.Jennifer Washington is a 25 y.o. female G1P0 @ [redacted]w[redacted]d here in MAU with abdominal pain. She reports roller skating 3 days ago and falling directly onto her abdomen. No bleeding. She does report lower abdominal pain that is constant. The pain is sharp and stabbing. The pain started right after she fell. She has not tried anything for the discomfort. No vaginal discharge.   OB History     Gravida  1   Para      Term      Preterm      AB      Living         SAB      IAB      Ectopic      Multiple      Live Births              Past Medical History:  Diagnosis Date   Asthma     History reviewed. No pertinent surgical history.  No family history on file.  Social History   Tobacco Use   Smoking status: Never Smoker   Smokeless tobacco: Never Used  Substance Use Topics   Alcohol use: No   Drug use: No    Allergies: No Known Allergies  Medications Prior to Admission  Medication Sig Dispense Refill Last Dose   acetaminophen (TYLENOL) 500 MG tablet Take 1 tablet (500 mg total) by mouth every 6 (six) hours as needed. 30 tablet 0 Unknown at Unknown time   benzonatate (TESSALON) 100 MG capsule Take 1 capsule (100 mg total) by mouth every 8 (eight) hours. 21 capsule 0    etonogestrel (NEXPLANON) 68 MG IMPL implant 1 each by Subdermal route once.      famotidine (PEPCID) 20 MG tablet Take 1 tablet (20 mg total) by mouth 2 (two) times daily for 7 days. Then as needed for reflux/heartburn symptoms 30 tablet 0    HYDROcodone-acetaminophen (NORCO/VICODIN) 5-325 MG tablet Take 1 tablet by mouth every 4 (four) hours as needed. 10 tablet 0    ibuprofen (ADVIL) 600 MG tablet Take 1 tablet (600 mg total) by mouth every 6 (six) hours as needed. 30 tablet 0  Unknown at Unknown time   methocarbamol (ROBAXIN) 500 MG tablet Take 1 tablet (500 mg total) by mouth 2 (two) times daily. 20 tablet 0 Unknown at Unknown time   metroNIDAZOLE (FLAGYL) 500 MG tablet Take 1 tablet (500 mg total) by mouth 2 (two) times daily. 14 tablet 0 Unknown at Unknown time   ondansetron (ZOFRAN ODT) 4 MG disintegrating tablet Take 1 tablet (4 mg total) by mouth every 8 (eight) hours as needed for nausea or vomiting. 15 tablet 0 Unknown at Unknown time   ondansetron (ZOFRAN) 4 MG tablet Take 1 tablet (4 mg total) by mouth every 6 (six) hours as needed for nausea or vomiting. 12 tablet 0    Results for orders placed or performed during the hospital encounter of 11/09/20 (from the past 48 hour(s))  Urinalysis, Routine w reflex microscopic Urine, Clean Catch     Status: Abnormal   Collection Time: 11/09/20  5:16 PM  Result Value Ref Range   Color, Urine YELLOW YELLOW   APPearance HAZY (A) CLEAR  Specific Gravity, Urine 1.026 1.005 - 1.030   pH 7.0 5.0 - 8.0   Glucose, UA NEGATIVE NEGATIVE mg/dL   Hgb urine dipstick NEGATIVE NEGATIVE   Bilirubin Urine NEGATIVE NEGATIVE   Ketones, ur NEGATIVE NEGATIVE mg/dL   Protein, ur NEGATIVE NEGATIVE mg/dL   Nitrite NEGATIVE NEGATIVE   Leukocytes,Ua TRACE (A) NEGATIVE   RBC / HPF 0-5 0 - 5 RBC/hpf   WBC, UA 11-20 0 - 5 WBC/hpf   Bacteria, UA MANY (A) NONE SEEN   Squamous Epithelial / LPF 6-10 0 - 5    Comment: Performed at [facility], [address] [place], [place] [address]   Review of Systems  Constitutional: Negative for fever.  Gastrointestinal: Positive for abdominal pain.  Genitourinary: Negative for vaginal bleeding and vaginal discharge.   Physical Exam   Blood pressure 125/70, pulse 79, temperature 98.6 F (37 C), temperature source Oral, resp. rate 16, height 5\' 4"  (1.626 m), weight 60.8 kg, last menstrual period 09/04/2020, SpO2 99 %.  Physical Exam Constitutional:      General: She is not in acute  distress.    Appearance: She is well-developed. She is obese. She is not ill-appearing, toxic-appearing or diaphoretic.  HENT:     Head: Normocephalic.  Abdominal:     Tenderness: There is generalized abdominal tenderness. There is no guarding or rebound.  Neurological:     Mental Status: She is alert and oriented to person, place, and time.  Psychiatric:        Behavior: Behavior normal.    MAU Course  Procedures  None  MDM  O positive blood type  Urine culture ordered 11/02/2020 OB ultrasound for viability  Tylenol 1 gram given PO: patient reports 0/10 pain following.   Assessment and Plan   1. Fall, initial encounter   2. Abdominal pain affecting pregnancy   3. Subchorionic hematoma, antepartum, single or unspecified fetus     P:  Discharge home in stable condition Start prenatal care Prenatal vitamins daily Ok to use tylenol OTC as directed on the bottle Return to MAU if symptoms worsen Reviewed Subchorionic hemorrhage in detail prior to DC  Rasch, [place], NP 11/09/2020 7:39 PM  Attestation of Attending Supervision of Advanced Practice Provider (PA/CNM/NP): Evaluation and management procedures were performed by the Advanced Practice Provider under my supervision and collaboration.  I have reviewed the Advanced Practice Provider's note and chart, and I agree with the management and plan. I have also made any necessary editorial changes.   11/11/2020, DO Attending Obstetrician & Gynecologist, [facility] for [facility], [facility] 11/09/2020 8:05 PM

## 2020-11-09 NOTE — MAU Note (Signed)
Jennifer Washington is a 25 y.o. at [redacted]w[redacted]d here in MAU reporting: 3 days ago was roller skating and was tripped and fell on her stomach. Since then has been having sharp pain. Pain is constant. No bleeding or discharge.   LMP: 09/04/20, states period was 2 weeks long, that is normal for her  Onset of complaint: ongoing  Pain score: 8/10  Vitals:   11/09/20 1641 11/09/20 1725  BP: 114/60 125/70  Pulse: 77 79  Resp: 16 16  Temp: 99.4 F (37.4 C) 98.6 F (37 C)  SpO2: 98% 99%     Lab orders placed from triage: UA

## 2020-11-09 NOTE — ED Triage Notes (Signed)
Emergency Medicine Provider Triage Evaluation Note  Jennifer Washington , a 25 y.o. female  was evaluated in triage.  Pt complains of abdominal pain after fall 3 days ago, + pregnancy estimated 6 weeks.  Review of Systems  Positive: abd pain Negative: Vaginal bleeding  Physical Exam  BP 114/60   Pulse 77   Temp 99.4 F (37.4 C)   Resp 16   SpO2 98%  Gen:   Awake, no distress   HEENT:  Atraumatic  Resp:  Normal effort  Cardiac:  Normal rate  Abd:   Nondistended MSK:   Moves extremities without difficulty Neuro:  Speech clear   Medical Decision Making  Medically screening exam initiated at 4:56 PM.  Appropriate orders placed.  Jennifer Washington was informed that the remainder of the evaluation will be completed by another provider, this initial triage assessment does not replace that evaluation, and the importance of remaining in the ED until their evaluation is complete.  Clinical Impression  Patient accepted by [facility] Dike for transfer to [facility]   Arthor Captain, PA-C 11/09/20 1657

## 2020-11-09 NOTE — Discharge Instructions (Signed)
Abdominal Pain During Pregnancy Belly (abdominal) pain is common during pregnancy. There are many possible causes. Some causes are more serious than others. Sometimes the cause is not known. Always tell your doctor if you have belly pain. Follow these instructions at home:  Do not have sex or put anything in your vagina until your pain goes away completely.  Get plenty of rest until your pain gets better.  Drink enough fluid to keep your pee (urine) pale yellow.  Take over-the-counter and prescription medicines only as told by your doctor.  Keep all follow-up visits.   Contact a doctor if:  You keep having pain after resting.  Your pain gets worse after resting.  You have lower belly pain that: ? Comes and goes at regular times. ? Spreads to your back. ? Feels like menstrual cramps.  You have pain or burning when you pee (urinate). Get help right away if:  You have a fever or chills.  You feel like it is hard to breathe.  You have bleeding from your vagina.  You are leaking fluid or tissue from your vagina.  You vomit for more than 24 hours.  You have watery poop (diarrhea) for more than 24 hours.  Your baby is moving less than usual.  You feel very weak or faint.  You have very bad pain in your upper belly. Summary  Belly pain is common during pregnancy. There are many possible causes.  If you have belly pain during pregnancy, tell your doctor right away.  Keep all follow-up visits. This information is not intended to replace advice given to you by your health care provider. Make sure you discuss any questions you have with your health care provider. Document Revised: 04/26/2020 Document Reviewed: 04/26/2020 Elsevier Patient Education  2021 [employer]  Subchorionic Hematoma  A hematoma is a collection of blood outside of the blood vessels. A subchorionic hematoma is a collection of blood between the outer wall of the embryo (chorion) and the inner wall of  the uterus. This condition can cause vaginal bleeding. Early small hematomas usually shrink on their own and do not affect your baby or pregnancy. When bleeding starts later in pregnancy, or if the hematoma is larger or occurs in older pregnant women, the condition may be more serious. Larger hematomas increase the chances of miscarriage. This condition also increases the risk of:  Premature separation of the placenta from the uterus.  Premature (preterm) labor.  Stillbirth. What are the causes? The exact cause of this condition is not known. It occurs when blood is trapped between the placenta and the uterine wall because the placenta has separated from the original site of implantation. What increases the risk? You are more likely to develop this condition if:  You were treated with fertility medicines.  You became pregnant through in vitro fertilization (IVF). What are the signs or symptoms? Symptoms of this condition include:  Vaginal spotting or bleeding.  Abdominal pain. This is rare. Sometimes you may have no symptoms and the bleeding may only be seen when ultrasound images are taken (transvaginal ultrasound). How is this diagnosed? This condition is diagnosed based on a physical exam. This includes a pelvic exam. You may also have other tests, including:  Blood tests.  Urine tests.  Ultrasound of the abdomen. How is this treated? Treatment for this condition can vary. Treatment may include:  Watchful waiting. You will be monitored closely for any changes in bleeding.  Medicines.  Activity restriction. This may be  needed until the bleeding stops.  A medicine called Rh immunoglobulin. This is given if you have an Rh-negative blood type. It prevents Rh sensitization. Follow these instructions at home:  Stay on bed rest if told to do so by your health care provider.  Do not lift anything that is heavier than 10 lb (4.5 kg), or the limit that you are told by your  health care provider.  Track and write down the number of pads you use each day and how soaked (saturated) they are.  Do not use tampons.  Keep all follow-up visits. This is important. Your health care provider may ask you to have follow-up blood tests or ultrasound tests or both. Contact a health care provider if:  You have any vaginal bleeding.  You have a fever. Get help right away if:  You have severe cramps in your stomach, back, abdomen, or pelvis.  You pass large clots or tissue. Save any tissue for your health care provider to look at.  You faint.  You become light-headed or weak. Summary  A subchorionic hematoma is a collection of blood between the outer wall of the embryo (chorion) and the inner wall of the uterus.  This condition can cause vaginal bleeding.  Sometimes you may have no symptoms and the bleeding may only be seen when ultrasound images are taken.  Treatment may include watchful waiting, medicines, or activity restriction.  Keep all follow-up visits. Get help right away if you have severe cramps or heavy vaginal bleeding. This information is not intended to replace advice given to you by your health care provider. Make sure you discuss any questions you have with your health care provider. Document Revised: 05/09/2020 Document Reviewed: 05/09/2020 Elsevier Patient Education  2021 [employer].

## 2020-11-09 NOTE — Progress Notes (Signed)
Written and verbal d/c instructions given and understanding voiced. 

## 2020-11-12 LAB — CULTURE, OB URINE: Culture: >=100000 — AB

## 2020-11-14 ENCOUNTER — Telehealth: Payer: Self-pay | Admitting: Obstetrics and Gynecology

## 2020-11-14 ENCOUNTER — Encounter: Payer: Self-pay | Admitting: Obstetrics and Gynecology

## 2020-11-14 DIAGNOSIS — O99820 Streptococcus B carrier state complicating pregnancy: Secondary | ICD-10-CM | POA: Insufficient documentation

## 2020-11-14 DIAGNOSIS — O2340 Unspecified infection of urinary tract in pregnancy, unspecified trimester: Secondary | ICD-10-CM | POA: Insufficient documentation

## 2020-11-14 MED ORDER — CEPHALEXIN 500 MG PO CAPS: 500.0000 mg | ORAL_CAPSULE | Freq: Four times a day (QID) | ORAL | 0 refills | Status: AC

## 2020-11-14 NOTE — Telephone Encounter (Signed)
+   UTI, + GBS. Both added to problem list.  Attempted to call patient to discuss antibiotics.  Rx: Kelfex sent.  Will attempt to call patient this afternoon.    Venia Carbon I, NP 11/14/2020 9:05 AM
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
## 2020-11-22 ENCOUNTER — Telehealth: Payer: Self-pay | Admitting: Obstetrics and Gynecology

## 2020-11-22 NOTE — Telephone Encounter (Signed)
2nd attempt to contact patient regarding + urine culture.   Rx: Keflex sent. No answer, message left.  Duane Lope, NP 11/22/2020 4:56 PM

## 2020-11-25 ENCOUNTER — Ambulatory Visit: Payer: Self-pay | Admitting: *Deleted

## 2020-11-25 NOTE — Telephone Encounter (Signed)
C/o nausea and vomiting yesterday. Patient is 9 weeks and 6 days pregnant. Vomiting yellow fluid. Gets nausea and "hot' and vomits and it looks like bile. Also reported sudden middle abdominal pain at times stabbing and makes her stop moving then goes away . Patient took promethazine her friend had and it did not relieve nausea. encouraged patient to take only prescribed medication from her dr. Denies dizziness or lightheadedness, no vaginal bleeding .denies dehydration.  Encouraged patient to go to UC or ED if nausea and abdominal pain persist. Patient appt not for a few weeks. Care advise given for food choices  For nausea. Patient verbalized understanding of care advise and to call back or go to [facility] or ED if symptoms worsen.   Reason for Disposition . MILD-MODERATE vomiting (e.g., 1-5 times / day) or nausea . Mild abdominal pain  Answer Assessment - Initial Assessment Questions 1. LOCATION: "Where does it hurt?"      Middle of abdomen 2. RADIATION: "Does the pain shoot anywhere else?" (e.g., chest, back, shoulder)     No  3. ONSET: "When did the pain begin?" (e.g., minutes, hours or days ago)      Has been on going 4. ONSET: "Gradual or sudden onset?"     Comes on sudden and then goes away 5. PATTERN "Does the pain come and go, or has it been constant since it started?"      Comes and goes  6. SEVERITY: "How bad is the pain?" "What does it keep you from doing?"  (e.g., Scale 1-10; mild, moderate, or severe)   - MILD (1-3): doesn't interfere with normal activities, abdomen soft and not tender to touch    - MODERATE (4-7): interferes with normal activities or awakens from sleep, tender to touch    - SEVERE (8-10): excruciating pain, doubled over, unable to do any normal activities     Patient reports severe stabbing pain that stops her from moving and goes away 7. RECURRENT SYMPTOM: "Have you ever had this type of stomach pain before?" If Yes, ask: "When was the last time?" and "What happened  that time?"      No  8. CAUSE: "What do you think is causing the stomach pain?"    Does not know  9. RELIEVING/AGGRAVATING FACTORS: "What makes it better or worse?" (e.g., antacids, bowel movement, movement)     Na  10. OTHER SYMPTOMS: "Has there been any vaginal bleeding, fever, vomiting, diarrhea, or urine problems?"       Vomiting "bile" yellow in color  11. EDD: "What date are you expecting to deliver?"       07/04/21  Protocols used: PREGNANCY - MORNING SICKNESS (NAUSEA AND VOMITING OF PREGNANCY)-A-AH, PREGNANCY - ABDOMINAL PAIN LESS THAN [redacted] WEEKS EGA-A-AH

## 2021-09-12 IMAGING — US US ART/VEN ABD/PELV/SCROTUM DOPPLER LTD
1 series · 13 of 25 positions shown · non-contrast
Comparison: None.

CLINICAL DATA: Left lower quadrant, back pain

EXAM:
TRANSABDOMINAL AND TRANSVAGINAL ULTRASOUND OF PELVIS
DOPPLER ULTRASOUND OF OVARIES
TECHNIQUE: Both transabdominal and transvaginal ultrasound examinations of the
pelvis were performed. Transabdominal technique was performed for
global imaging of the pelvis including uterus, ovaries, adnexal
regions, and pelvic cul-de-sac.
It was necessary to proceed with endovaginal exam following the
transabdominal exam to visualize the uterus, endometrium, ovaries
and adnexa. Color and duplex Doppler ultrasound was utilized to
evaluate blood flow to the ovaries.

[Series 1: us art/ven abd/pelv/scrotum doppler ltd · 13 of 48 slices shown]
[im 1/48]
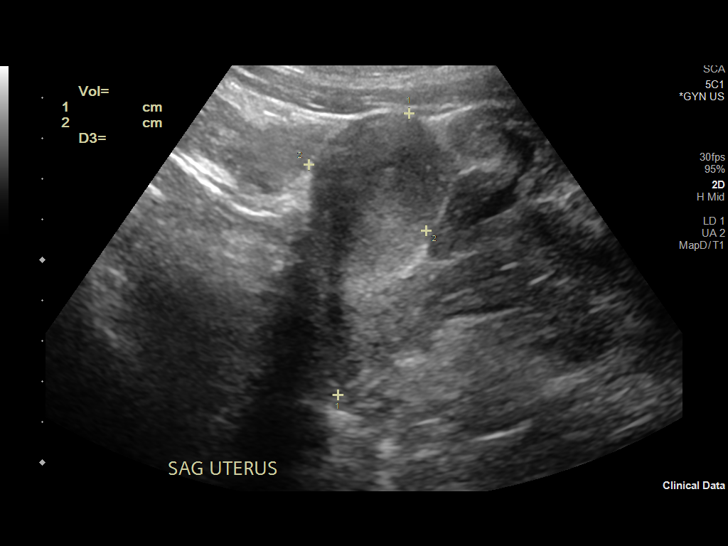
[im 4/48]
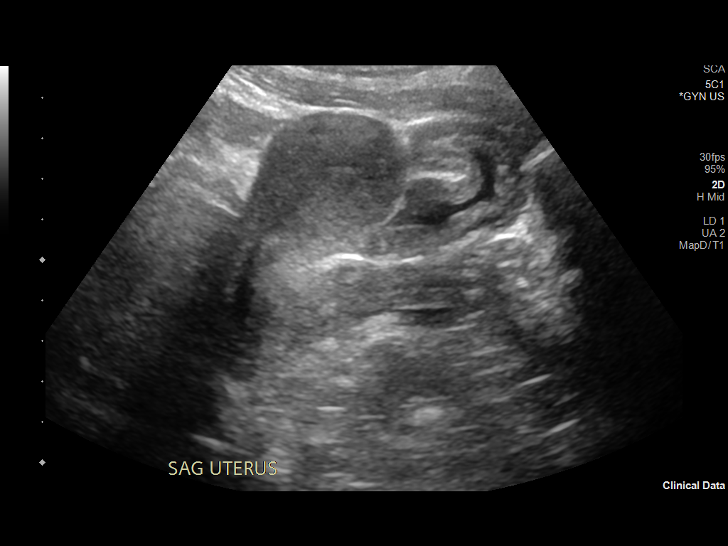
[im 8/48]
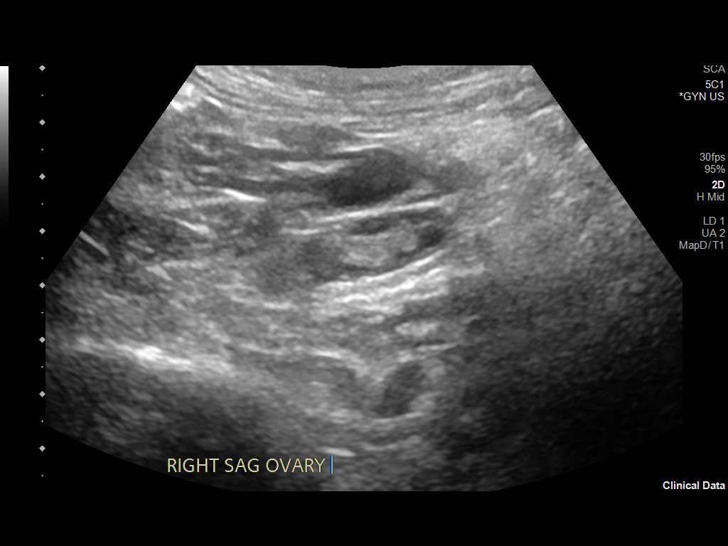
[im 12/48]
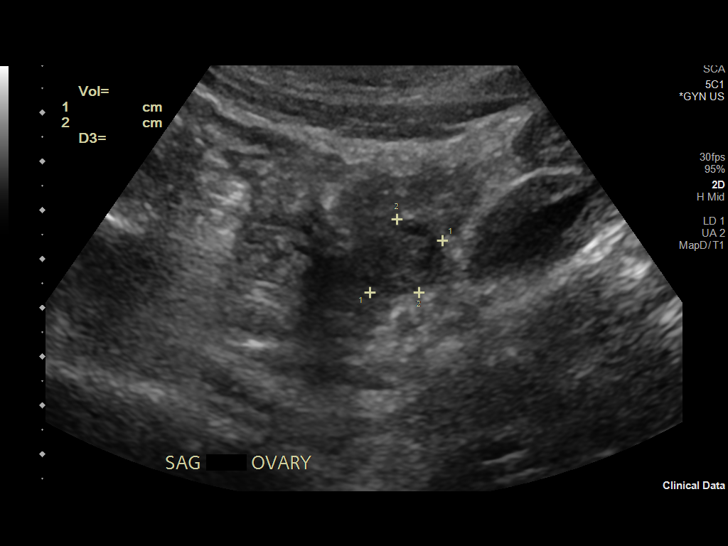
[im 16/48]
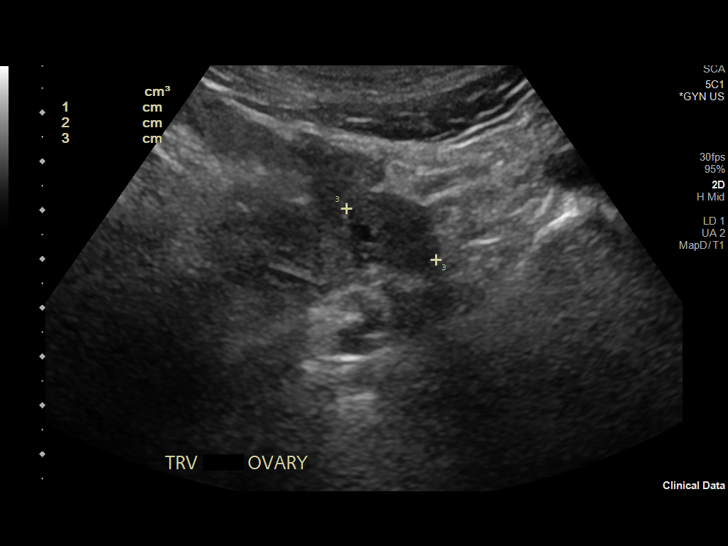
[im 20/48]
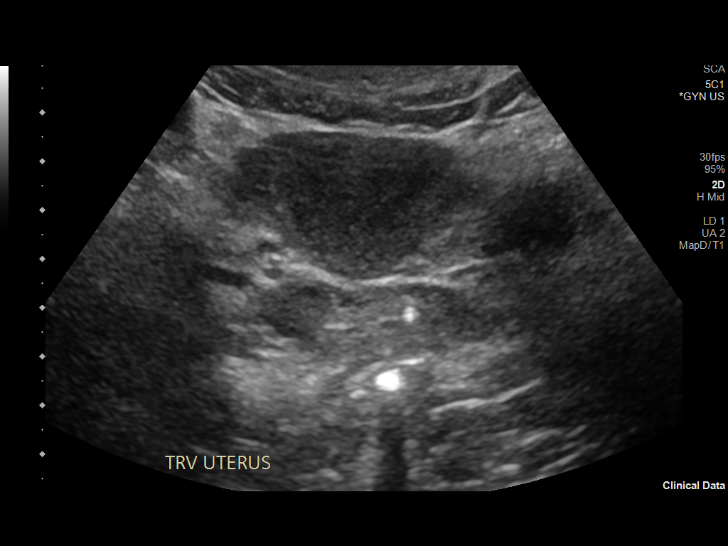
[im 24/48]
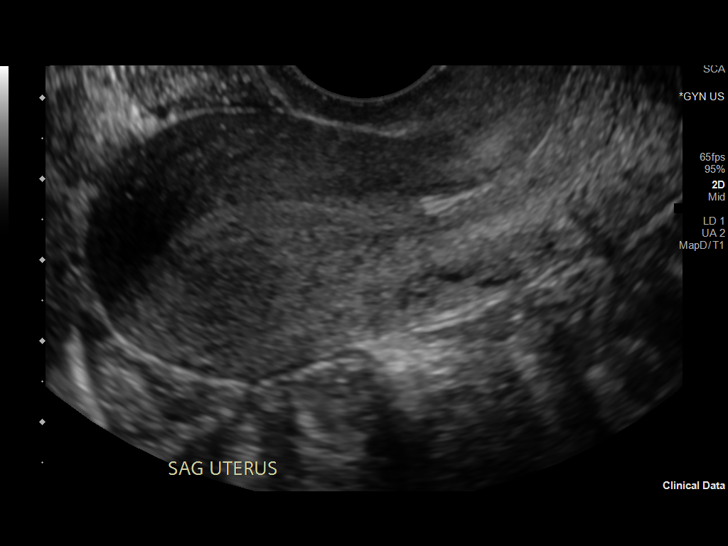
[im 28/48]
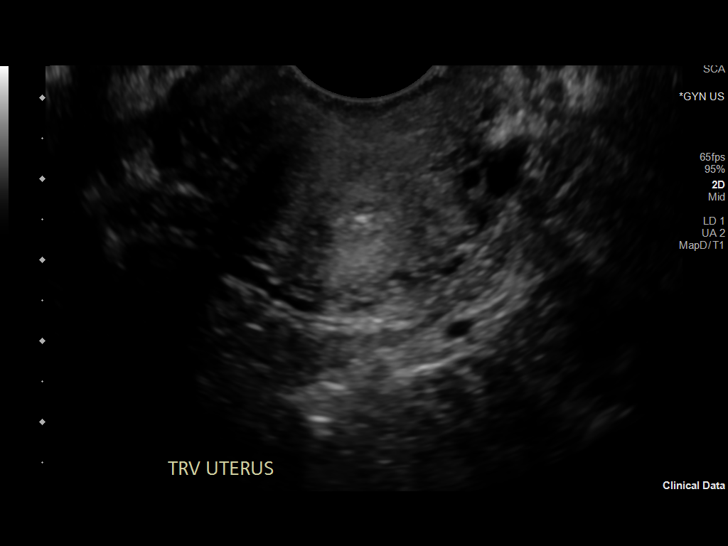
[im 32/48]
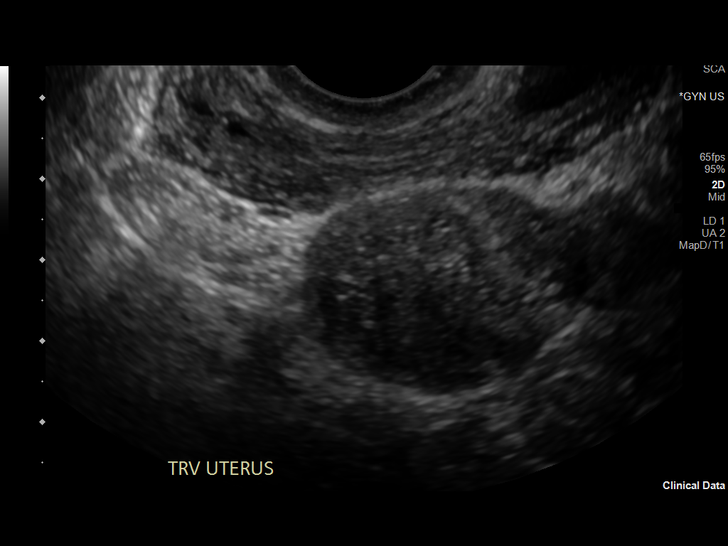
[im 36/48]
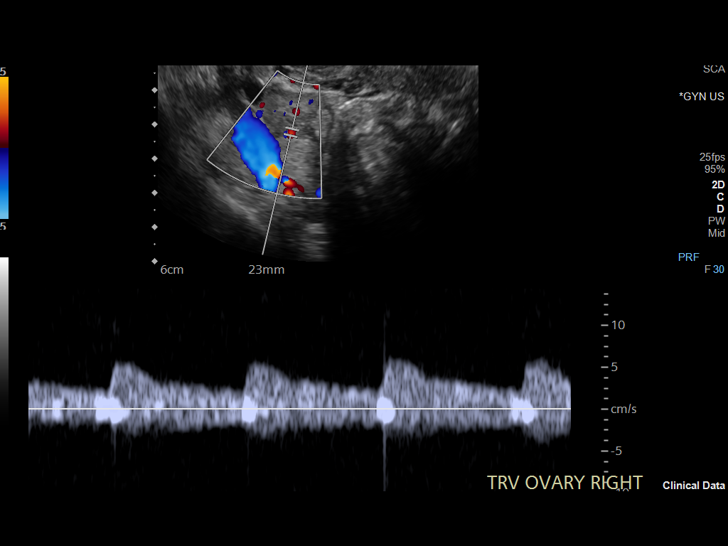
[im 40/48]
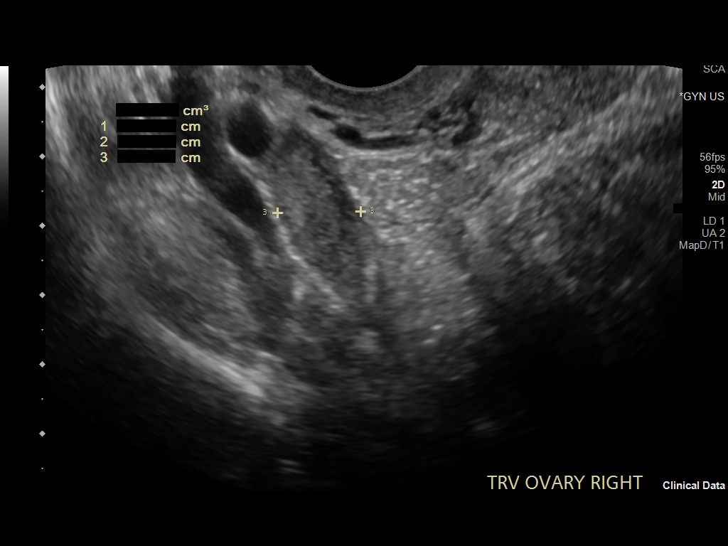
[im 44/48]
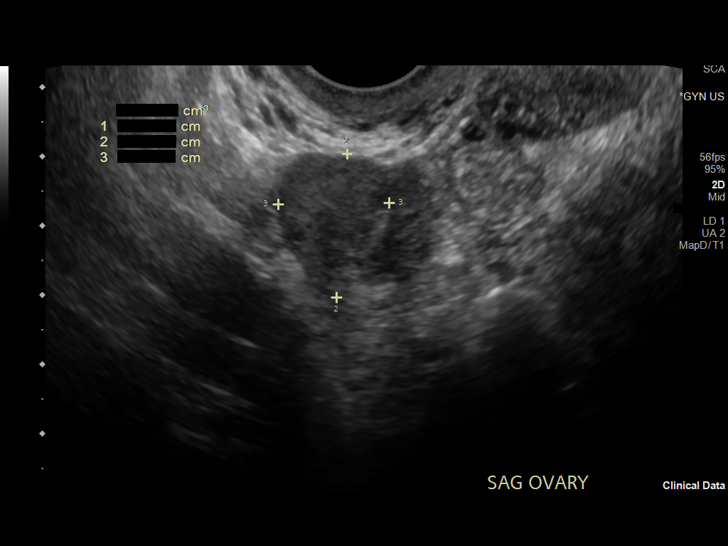
[im 48/48]
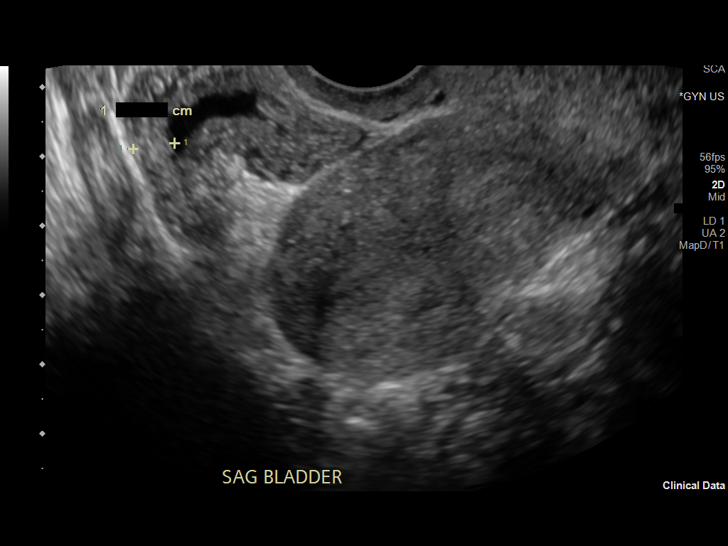

[13 of 25 positions shown; findings below may reference images not displayed]

FINDINGS: Uterus

Measurements: 7.4 x 3.2 x 3.9 cm = volume: 49 mL. No fibroids or
other mass visualized.

Endometrium

Thickness: 5 mm in thickness.  No focal abnormality visualized.

Right ovary

Measurements: 3.0 x 1.6 x 1.2 cm = volume: 3.0 mL. Normal
appearance/no adnexal mass.

Left ovary

Measurements: 2.5 x 2.0 x 1.6 cm = volume: 4.3 mL. Normal
appearance/no adnexal mass.

Pulsed Doppler evaluation of both ovaries demonstrates normal
low-resistance arterial and venous waveforms.

Other findings

No abnormal free fluid.
IMPRESSION: Normal study.
